# Patient Record
Sex: Female | Born: 1975 | Race: White | Hispanic: No | Marital: Married | Smoking: Never smoker
Health system: Southern US, Community
[De-identification: ages and names within clinical notes are randomized; demographics above are authoritative.]

## PROBLEM LIST (undated history)

## (undated) DIAGNOSIS — F909 Attention-deficit hyperactivity disorder, unspecified type: Secondary | ICD-10-CM

---

## 1999-08-03 ENCOUNTER — Emergency Department (HOSPITAL_COMMUNITY): Admission: EM | Admit: 1999-08-03 | Discharge: 1999-08-03 | Payer: Self-pay | Admitting: Emergency Medicine

## 1999-08-27 ENCOUNTER — Other Ambulatory Visit: Admission: RE | Admit: 1999-08-27 | Discharge: 1999-08-27 | Payer: Self-pay | Admitting: Obstetrics and Gynecology

## 2000-08-29 ENCOUNTER — Other Ambulatory Visit: Admission: RE | Admit: 2000-08-29 | Discharge: 2000-08-29 | Payer: Self-pay | Admitting: Obstetrics and Gynecology

## 2001-08-29 ENCOUNTER — Other Ambulatory Visit: Admission: RE | Admit: 2001-08-29 | Discharge: 2001-08-29 | Payer: Self-pay | Admitting: Obstetrics and Gynecology

## 2003-04-10 ENCOUNTER — Encounter: Admission: RE | Admit: 2003-04-10 | Discharge: 2003-04-10 | Payer: Self-pay | Admitting: Family Medicine

## 2003-04-30 ENCOUNTER — Ambulatory Visit (HOSPITAL_COMMUNITY): Admission: RE | Admit: 2003-04-30 | Discharge: 2003-04-30 | Payer: Self-pay | Admitting: *Deleted

## 2003-06-12 ENCOUNTER — Emergency Department (HOSPITAL_COMMUNITY): Admission: EM | Admit: 2003-06-12 | Discharge: 2003-06-12 | Payer: Self-pay | Admitting: Emergency Medicine

## 2004-01-21 ENCOUNTER — Other Ambulatory Visit: Admission: RE | Admit: 2004-01-21 | Discharge: 2004-01-21 | Payer: Self-pay | Admitting: Obstetrics and Gynecology

## 2004-02-06 ENCOUNTER — Ambulatory Visit (HOSPITAL_COMMUNITY): Admission: RE | Admit: 2004-02-06 | Discharge: 2004-02-06 | Payer: Self-pay | Admitting: Obstetrics and Gynecology

## 2004-05-15 ENCOUNTER — Ambulatory Visit (HOSPITAL_COMMUNITY): Admission: RE | Admit: 2004-05-15 | Discharge: 2004-05-15 | Payer: Self-pay | Admitting: Obstetrics and Gynecology

## 2004-07-09 ENCOUNTER — Encounter (INDEPENDENT_AMBULATORY_CARE_PROVIDER_SITE_OTHER): Payer: Self-pay | Admitting: *Deleted

## 2004-07-09 ENCOUNTER — Ambulatory Visit (HOSPITAL_COMMUNITY): Admission: RE | Admit: 2004-07-09 | Discharge: 2004-07-09 | Payer: Self-pay | Admitting: Obstetrics and Gynecology

## 2005-02-04 ENCOUNTER — Other Ambulatory Visit: Admission: RE | Admit: 2005-02-04 | Discharge: 2005-02-04 | Payer: Self-pay | Admitting: Obstetrics and Gynecology

## 2005-02-19 ENCOUNTER — Inpatient Hospital Stay (HOSPITAL_COMMUNITY): Admission: AD | Admit: 2005-02-19 | Discharge: 2005-02-19 | Payer: Self-pay | Admitting: Obstetrics and Gynecology

## 2005-08-08 ENCOUNTER — Inpatient Hospital Stay (HOSPITAL_COMMUNITY): Admission: AD | Admit: 2005-08-08 | Discharge: 2005-08-11 | Payer: Self-pay | Admitting: Obstetrics and Gynecology

## 2006-02-10 ENCOUNTER — Other Ambulatory Visit: Admission: RE | Admit: 2006-02-10 | Discharge: 2006-02-10 | Payer: Self-pay | Admitting: Obstetrics and Gynecology

## 2006-05-17 ENCOUNTER — Emergency Department (HOSPITAL_COMMUNITY): Admission: EM | Admit: 2006-05-17 | Discharge: 2006-05-17 | Payer: Self-pay | Admitting: Emergency Medicine

## 2007-09-01 ENCOUNTER — Encounter (INDEPENDENT_AMBULATORY_CARE_PROVIDER_SITE_OTHER): Payer: Self-pay | Admitting: Obstetrics and Gynecology

## 2007-09-01 ENCOUNTER — Ambulatory Visit (HOSPITAL_COMMUNITY): Admission: RE | Admit: 2007-09-01 | Discharge: 2007-09-01 | Payer: Self-pay | Admitting: Obstetrics and Gynecology

## 2008-04-16 ENCOUNTER — Inpatient Hospital Stay (HOSPITAL_COMMUNITY): Admission: AD | Admit: 2008-04-16 | Discharge: 2008-04-16 | Payer: Self-pay | Admitting: Obstetrics and Gynecology

## 2008-08-12 ENCOUNTER — Inpatient Hospital Stay (HOSPITAL_COMMUNITY): Admission: RE | Admit: 2008-08-12 | Discharge: 2008-08-14 | Payer: Self-pay | Admitting: Obstetrics and Gynecology

## 2010-05-02 ENCOUNTER — Encounter: Payer: Self-pay | Admitting: Family Medicine

## 2010-07-21 LAB — CBC
HCT: 26.6 % — ABNORMAL LOW (ref 36.0–46.0)
HCT: 32.2 % — ABNORMAL LOW (ref 36.0–46.0)
Hemoglobin: 11.2 g/dL — ABNORMAL LOW (ref 12.0–15.0)
Hemoglobin: 9.2 g/dL — ABNORMAL LOW (ref 12.0–15.0)
MCHC: 34.5 g/dL (ref 30.0–36.0)
MCHC: 34.9 g/dL (ref 30.0–36.0)
MCV: 89.5 fL (ref 78.0–100.0)
MCV: 89.9 fL (ref 78.0–100.0)
Platelets: 133 10*3/uL — ABNORMAL LOW (ref 150–400)
Platelets: 159 10*3/uL (ref 150–400)
RBC: 2.96 MIL/uL — ABNORMAL LOW (ref 3.87–5.11)
RBC: 3.6 MIL/uL — ABNORMAL LOW (ref 3.87–5.11)
RDW: 13.7 % (ref 11.5–15.5)
RDW: 14.4 % (ref 11.5–15.5)
WBC: 10.1 10*3/uL (ref 4.0–10.5)
WBC: 7.3 10*3/uL (ref 4.0–10.5)

## 2010-07-21 LAB — RPR: RPR Ser Ql: NONREACTIVE

## 2010-07-21 LAB — CCBB MATERNAL DONOR DRAW

## 2010-07-27 LAB — URINALYSIS, ROUTINE W REFLEX MICROSCOPIC
Bilirubin Urine: NEGATIVE
Glucose, UA: NEGATIVE mg/dL
Ketones, ur: NEGATIVE mg/dL
Leukocytes, UA: NEGATIVE
Nitrite: NEGATIVE
Protein, ur: NEGATIVE mg/dL
Specific Gravity, Urine: >1.03 — ABNORMAL HIGH (ref 1.005–1.030)
Urobilinogen, UA: 0.2 mg/dL (ref 0.0–1.0)
pH: 5 (ref 5.0–8.0)

## 2010-07-27 LAB — URINE CULTURE: Colony Count: >=100000

## 2010-07-27 LAB — URINE MICROSCOPIC-ADD ON

## 2010-07-27 LAB — GC/CHLAMYDIA PROBE AMP, GENITAL
Chlamydia, DNA Probe: NEGATIVE
GC Probe Amp, Genital: NEGATIVE

## 2010-07-27 LAB — WET PREP, GENITAL
Clue Cells Wet Prep HPF POC: NONE SEEN
Trich, Wet Prep: NONE SEEN
Yeast Wet Prep HPF POC: NONE SEEN

## 2010-08-25 NOTE — Op Note (Signed)
Laura Ray, MOORADIAN NO.:  [id number]   MEDICAL RECORD NO.:  [id number]          PATIENT TYPE:  AMB   LOCATION:  SDC                           [facility]   PHYSICIAN:  Dois Davenport A. Rivard, M.D. DATE OF BIRTH:  [birth year]   DATE OF PROCEDURE:  09/01/2007  DATE OF DISCHARGE:                               OPERATIVE REPORT   PREOPERATIVE DIAGNOSIS:  Moderate cervical dysplasia or CIN II.   POSTOPERATIVE DIAGNOSIS:  Moderate cervical dysplasia or CIN II.   ANESTHESIA:  Paracervical block, Dois Davenport A. Rivard, MD   PROCEDURE:  Loop electrical excision procedure or LEEP.   SURGEON:  Crist Fat. Rivard, MD   ESTIMATED BLOOD LOSS:  Minimal.   PROCEDURE:  After being informed of the planned procedure with possible  complications including bleeding, infection, and recurrence of disease  as well as cervical stenosis and cervical incompetence, informed consent  was obtained.  The patient was taken to the OR #3 and placed in  lithotomy position.  A speculum was inserted in the lip and the cervix  was prepped with acetic acid.  Using colposcope visualization, we were  able to see the T-zone and noted that the lesion was at 2 o'clock,  previously described and biopsied.  Using a large loupe, we proceed with  excision of the anterior lip as well as the posterior lip of the cervix.  We then cauterized the excision area and applied Monsel.  Instruments  were removed.  Instruments and sponge count was complete x2.  Estimated  blood loss was minimal, and the procedure was very well tolerated by the  patient who was taken to the recovery room in a well and stable  condition.   SPECIMENS:  Anterior lip and posterior lip of the cervix sent to  pathology.   Please note that after prepping the cervix with acetic acid, we perform  a paracervical block using 19 mL of lidocaine 1% with epinephrine  1:200,000, well tolerated by the patient.      Crist Fat Rivard, M.D.  Electronically Signed     SAR/MEDQ  D:  09/01/2007  T:  09/02/2007  Job:  [id number]

## 2010-08-25 NOTE — H&P (Signed)
NAMELIBERTA, Laura Ray NO.:  [id number]   MEDICAL RECORD NO.:  [id number]          PATIENT TYPE:  INP   LOCATION:                                [facility]   PHYSICIAN:  Crist Fat. Rivard, M.D. DATE OF BIRTH:  09/28/1975   DATE OF ADMISSION:  08/12/2008  DATE OF DISCHARGE:                              HISTORY & PHYSICAL   Ms. Rog is a 35 year old gravida 2, para 1-0-0-1, at 40-1/7 weeks who  presents for induction secondary to post dates.  Her pregnancy is  remarkable for:  1. History of anemia.  2. History of positive group B strep with previous pregnancy.  3. History of LEEP procedure.  4. 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  5. History of infertility, with this pregnancy an IVF pregnancy.  6. Father of baby with craniotomy with a type 2 glioma.   PRENATAL LABS:  Blood type is A positive, Rh antibody negative, VDRL  nonreactive, Rubella titer positive, hepatitis B surface antigen  negative, HIV is nonreactive.  Pap was normal in September 2009 with  ASCUS, with negative HPV.  Hemoglobin upon entering the practice was  11.1.  It was 10.3 at 27 weeks.  She had a first trimester screen that  was normal.  AFP was done that was normal.  Glucola was elevated at 147.  She had a 3-hour GTT that was normal.  Group B strep culture was  negative at 36 weeks.   HISTORY OF PRESENT PREGNANCY:  The patient entered care at approximately  10 weeks.  She had a first trimester screen that was normal.  She had an  ultrasound at that time showing a posterior previa.  She had had a small  amount of first trimester bleeding.  She had also had a Pap in January  2010 that was normal.  She had a follow-up ultrasound at 12 weeks still  showing a posterior previa.  Previa still persisted at 14 weeks.  First  trimester screen was normal.  She got a flu vaccine at 19 weeks.  She  had an ultrasound at that time showing no previa, normal  cervical length  and normal growth.  She had some palpitations at 23 weeks.  These  resolved.  Her last bleeding episode occurred at approximately 24 weeks  but has had none since that time.  She had an ultrasound at [facility] at that time without any concerns.  Her Glucola was elevated at  147.  Three hour GTT was normal.  She had some dizziness spells at 32  weeks.  She was encouraged to increase fluids.  She had a beta strep  done at 35 weeks which was negative.  She also began Valtrex  prophylaxis.  She denies any HSV prodrome or lesions.   OBSTETRICAL HISTORY:  In 2007, she had an in vitro fertilization  pregnancy. She delivered a full-term female at 40 weeks.  The baby weighed  7 pounds 14 ounces.  She was in labor for 11 hours.  She had epidural  anesthesia.  She had no complications.  Her second pregnancy is also  IVF.   MEDICAL HISTORY:  She is a previous oral contraceptive user.  She was  diagnosed with an abnormal Pap in January 2009 which was CIN-3 and she  had a LEEP procedure in May.  She also had a history of a right ovarian  cyst.  She reports usual childhood illnesses and occasional yeast  infections.  She reports anemia as a child.  She had some issues with  slow digestion with her stomach not emptying properly and nausea.  She  had had some anxiety associated with that.   SURGICAL HISTORY:  Previously noted LEEP procedure in May 2009.  She  also had a laparoscopic procedure for removal of an ovarian cyst in  March 2006.   ALLERGIES:  Z-PAK which causes shortness of breath and nausea.   FAMILY HISTORY:  Paternal grandfather had an MI and is now deceased.  Her father has chronic hypertension.  Paternal grandmother also has  hypertension.  Her mother has varicosities.  Maternal cousin had a  pituitary tumor.  Paternal grandmother and paternal uncle had stroke.  Paternal cousin had Marfan's syndrome.  Paternal uncle also has  depression.  Maternal grandmother  had colon cancer.  Maternal  grandfather had lung cancer.  Maternal aunt had cervical cancer and  lymphoma.   GENETIC HISTORY:  Remarkable for the father of baby having had a  craniotomy for a type 2 glioma, left hemisphere surgery in 2007.  Maternal grandfather is a smoker.  Father of the baby has a questionable  heart murmur.   SOCIAL HISTORY:  The patient is married to the father of baby.  He is  involved and supportive.  His name is Tenika Keeran.  The patient has a  bachelor's degree.  She is employed as a Veterinary surgeon.  Her husband  has 3 years of college.  He is employed in Insurance account manager.  She has been  followed by the physician service at [facility].  She denies  any alcohol, drug or tobacco use during this pregnancy.   PHYSICAL EXAMINATION:  VITAL SIGNS: Stable.  The patient is afebrile.  HEENT: Within normal limits.  LUNGS:  Breath sounds are clear.  HEART:  Regular rate and rhythm without murmur.  BREASTS:  Soft and nontender.  ABDOMEN:  Fundal height is approximately 39 centimeters.  Estimated  fetal weight 7-8 pounds.  Uterine contractions are per patient report  occasional and mild.  CERVICAL EXAM:  Deferred at present.  Fetus was vertex on previous  evaluation in the office.  There are no HSV lesions noted and the  patient denies any prodrome.  Fetal heart rate has been reassuring in  the office.  EXTREMITIES:  Deep tendon reflexes are 2+ without clonus.  There is  trace edema noted.   IMPRESSION:  1. Intrauterine pregnancy at 41-1/7 weeks.  2. Negative group B strep.  3. History of LEEP procedure.  4. History of herpes simplex virus with no current or recent lesions      or prodrome   PLAN:  1. Admit to birthing suite per consult with Dr. Silverio Lay as      attending physician.  2. Routine physician orders.  3. Dr. Estanislado Pandy will anticipate Pitocin induction with artificial      rupture of membranes as appropriate  4. Pain medication p.r.n.      Renaldo Reel Emilee Hero, C.N.M.  Crist Fat Rivard, M.D.  Electronically Signed   VLL/MEDQ  D:  08/11/2008  T:  08/11/2008  Job:  [id number]

## 2010-08-28 NOTE — H&P (Signed)
NAMEPARRISH, DADDARIO NO.:  [id number]   MEDICAL RECORD NO.:  [id number]          PATIENT TYPE:  MAT   LOCATION:  MATC                          [facility]   PHYSICIAN:  Crist Fat. Rivard, M.D. DATE OF BIRTH:  [birth year]   DATE OF ADMISSION:  DATE OF DISCHARGE:                                HISTORY & PHYSICAL   Ms. Pfahler is a 35 year old, gravida 1, para 0, at 39-6/7 weeks, EDD August 09, 2005, who presents with spontaneous rupture of membranes at 14:45, clear  fluid. She reports positive fetal movement, irregular contractions, no  vaginal bleeding. Denies any headache, visual changes, or epigastric pain.  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  The patient has  no prodrome and no lesions. This patient was initially evaluated at the  office of [facility] on February 04, 2005, at 13-weeks gestation. This is an IVF  pregnancy which began as a twin gestation and resolved to one viable single  fetus. The patient's pregnancy has been essentially unremarkable. Her  husband has been diagnosed with a brain tumor during her pregnancy which has  been followed by MRI. He will plan surgery in mid June. The patient has been  size equal to dates throughout, normotensive with no proteinuria. Ultrasound  exam on July 15, 2005, found a single intrauterine fetus consistent with  measurements for 36 weeks 5 days, an estimated fetal weight of 30, 79 gm at  the 76th percentile, AFI 10.44 cm, with an anterior grade 2 placenta in the  vertex presentation.   Prenatal labwork on February 04, 2006, revealed hemoglobin of 10.8, platelets  223,000, blood type A positive, antibody screen negative, VDRL nonreactive,  Rubella immune, hepatitis B surface  antigen negative, HIV negative June  2006, hepatitis C negative June 2006, CF testing negative, positive urine  culture for group B strep on December 23, 2004.  At 21 weeks, one-hour  glucose challenge within normal limits and hemoglobin of 11.0.   The patient has an allergy to [facility] and she denies the use of tobacco,  alcohol, or illicit drugs.   Her pre-gravid weight is 140 and last recorded pregnancy weight is 179  pounds.   PAST MEDICAL HISTORY:  Significant for infertility, laparoscopy with right  ovarian cyst removed. She had IVF at [facility] for reproduction and  conceived. She has a history of stomach problems and anxiety, and reports  difficulty awakening following anesthesia.   FAMILY HISTORY:  Paternal grandfather with MI. Patient's father and paternal  grandmother with chronic hypertension.  Patient's mother with varicose  veins. Maternal cousin with pituitary tumor. Paternal grandmother and  paternal uncle with stroke. The patient's maternal grandmother with colon  cancer. Maternal grandfather lung cancer. Maternal aunt cervical cancer.   GENETIC HISTORY:  Unremarkable.   SOCIAL HISTORY:  Mrs. Keshishyan  is a married Caucasian female. Her husband,  Mckynlee Luse, is involved and supportive. The patient has her bachelor's  degree and works as a Veterinary surgeon, and her husband has had 15 years of  education and works in Press photographer. They do not subscribe  to a religious faith.   REVIEW OF SYSTEMS:  As described above. The patient is typical of one with  intrauterine pregnancy at term, with premature rupture of membranes without  labor.   PHYSICAL EXAMINATION:  VITAL SIGNS: Temperature 98.2, pulse 79, respirations  20, blood pressure 138/76.  HEENT: Unremarkable.  HEART: Regular rate and rhythm.  LUNGS: Clear bilaterally.  ABDOMEN: Soft and gravid in its contour. It is nontender. The uterine fundus  extends 39 cm above the level of the pubic  symphysis. Leopold's maneuvers  finds the infant to a longitudinal lie cephalic presentation and the  estimated fetal weight is 7.5 pounds. The baseline of the fetal heart rate  monitor is 130s with average long-term variability. Reactivity is present  with no periodic changes. The patient is contracting irregularity. Sterile  speculum exam finds positive pooling, positive Nitrazine, positive fern for  clear fluid.  It is note that the patient has no external and no internal  HSV lesions. Digital exam of the cervix finds it to be 2-3 cm dilated, 70%  effaced with a vertex and a -1 station. This is unchanged from previous  office exam.  EXTREMITIES: No pathologic edema. DTRs are 1+ with no clonus. There is no  calf tenderness noted bilaterally.   ASSESSMENT:  Intrauterine pregnancy at term, premature rupture of membranes.   PLAN:  Admit per Dr. Dois Davenport Rivard. Begin penicillin G prophylaxis for  positive group B strep. Start pitocin per low-dose protocol.      Rica Koyanagi, C.N.M.      Crist Fat Rivard, M.D.  Electronically Signed    SDM/MEDQ  D:  08/08/2005  T:  08/08/2005  Job:  [id number]

## 2010-08-28 NOTE — Op Note (Signed)
NAMEKRISTINIA, LEAVY NO.:  [id number]   MEDICAL RECORD NO.:  [id number]          PATIENT TYPE:  AMB   LOCATION:  SDC                           [facility]   PHYSICIAN:  Dois Davenport A. Rivard, M.D. DATE OF BIRTH:  [birth year]   DATE OF PROCEDURE:  07/09/2004  DATE OF DISCHARGE:                                 OPERATIVE REPORT   PREOPERATIVE DIAGNOSES:  1.  Persistent right ovarian cyst.  2.  Infertility.   POSTOPERATIVE DIAGNOSES:  1.  Bilateral ovarian cysts.  2.  Pelvic adhesions.  3.  Right paratubal cyst.  4.  Minimal endometriosis.   ANESTHESIA:  General.   PROCEDURES:  1.  Right ovarian cystectomy via laparoscopy.  2.  Left ovarian cyst drainage.  3.  Right paratubal cyst removal.  4.  Lysis of adhesions.  5.  Peritoneal biopsy.  6.  Ablation of endometriosis.  7.  Chromopertubation.   SURGEON:  Crist Fat. Rivard, M.D.   ASSISTANTMarquis Lunch. Adline Peals.   PROCEDURE:  After being informed of the planned procedure and possible  complications including bleeding, infection, injury to bowel, bladder or  ureters, need for laparotomy, need for oophorectomy, informed consent was  obtained.  The patient is taken to OR #4, given general anesthesia with  endotracheal intubation without any complication.  She is placed in a  lithotomy position, prepped and draped in a sterile fashion, and a Foley  catheter is inserted in her bladder.  A speculum is inserted in the vagina,  anterior lip of the cervix is grasped with tenaculum forceps, and an acorn  manipulator is placed in the uterus.  The speculum is removed.   The umbilical area is infiltrated with 5 mL of Marcaine 0.25, and we perform  a semi-elliptical incision for insertion of Veress needle and insufflation  of a pneumoperitoneum with CO2 at a maximum pressure of 15 mmHg.  This  allows [place] easy entry of a 10 mm trocar as well as a laparoscope on video.  The suprapubic area is infiltrated with 2.5 mL  of Marcaine 0.25%, a 5 mm  incision was performed with knife, and a 5 mm trocar is inserted under  direct visualization.   Observation:  Uterus is normal.  Right tube has a 1 cm paratubal cyst but  otherwise normal.  The left tube has a small, thin adhesion joining it to  the upper left colon.  This adhesion measures approximately 12 cm.  It does  not interfere with tubal anatomy or with its function.  The tube is  otherwise normal.  The anterior cul-de-sac reveals a few brown lesions  compatible with endometriosis.  The left ovary has two cysts of 1-2 cm each,  which appear to be corpus luteum.  The right ovary is greatly enlarged with  a cyst measuring 7 cm, an elongated tubo-ovarian ligament, and comb-like  vessels, favoring a benign organic cyst.  There is no anomaly on the surface  of the ovary.  Posterior cul-de-sac has multiple small 1-2 mm brown and red  lesions and two sites of  peritoneal retraction on the right uterosacral  ligament.   We start the procedure with a right ovarian cystectomy.  A solution of  Vasopressin 20 units in 100 mL is used with the spinal needle to infiltrate  the capsule of the cyst, 5 mL are used.  Using sharp scissors, we are able  to incise the capsule of the ovary without perforating the cyst, which  allows [place] to sharply dissect the cyst on a 3 cm area.  The cyst is then  punctured, clear fluid is removed, and we are able with traction, counter  traction, to completely peel the cyst off of the ovary.  This is sent to  pathology.  We then irrigate profusely with warm saline and cauterize the  internal capsule of the ovary for complete hemostasis.  On the left side,  the cyst is grasped and the ovary is grasped at the utero-ovarian ligament  side and the capsule is also infiltrated with 2.5 mL of diluted Vasopressin.  A small incision is made with scissors, which confirms corpus luteum cyst  with the typical yellow coloration.  Because of the functional  nature of  this cyst, we are not going to perform cystectomy but we do drain yellowish  fluid from it.   We then cauterize the left tube adhesions and section it.  The right  paratubal cyst is removed, first cauterized, then sectioned and then removed  and sent to pathology.  We perform three biopsies of the posterior cul-de-  sac peritoneum to confirm the diagnosis of endometriosis and then using  bipolar cauterization, we cauterize all visible lesion.  On the right  uterosacral ligament, we first identify the route of the right ureter, which  is away from our site of cauterization.  We then irrigate profusely with  warm saline, recheck hemostasis, which is very adequate.  We then visualize  the upper abdomen with a normal appendix, normal liver edge, normal  gallbladder.  No other lesions seen throughout the abdominal-pelvic cavity.   We deflate the abdomen to a minimum intra-abdominal pressure to evaluate  hemostasis on the site of cystectomy as well as cyst drainage, and  hemostasis is adequate.   A right lower quadrant 5 mm trocar was inserted in the right lower quadrant  after infiltrating with 2.5 mL of Marcaine 0.25%, and this was done under  direct visualization before we performed cystectomy.   We then perform chromopertubation with methylene blue, which reveals easy  passage and easy spillage in both tubes.  We irrigate profusely with warm  saline to remove all dye from the pelvis.   Both 5 mm trocars are then removed, the scope is removed, pneumoperitoneum  is evacuated, and the 10 mm trocar is then removed.  Fascia of the 10 mm  incision is closed with a figure-of-eight stitch of 0 Vicryl, and all three  incisions are then closed with subcuticular suture of 4-0 Vicryl and Steri-  Strips.   Foley catheter is removed.  Acorn manipulator is removed.  Instrument and sponge count is complete x2.  Estimated blood loss is 50 mL.  The procedure  is very well-tolerated by the  patient, who is taken to the recovery room in  a well and stable condition.     SAR/MEDQ  D:  07/09/2004  T:  07/09/2004  Job:  [id number]

## 2010-08-28 NOTE — H&P (Signed)
Laura Ray, Laura Ray NO.:  [id number]   MEDICAL RECORD NO.:  [id number]          PATIENT TYPE:  AMB   LOCATION:  SDC                           [facility]   PHYSICIAN:  Dois Davenport A. Rivard, M.D. DATE OF BIRTH:  10-20-1975   DATE OF ADMISSION:  DATE OF DISCHARGE:                                HISTORY & PHYSICAL   DATE OF ADMISSION:  June 25, 2004   REASON FOR ADMISSION:  Persistent right ovarian cyst.   HISTORY OF PRESENT ILLNESS:  This is a 35 year old married black female  gravida 0 who has been followed by me since July 2004 for difficulty  conceiving. She was diagnosed with luteal phase deficiency and was offered  Clomid induction. Her first cycle was in November 2005 for which she had no  complication. Her second cycle was December 2005 which led to the  development of a right ovarian simple cyst measuring 6.3 x 4.6 x 5.6 cm.  Clomid induction was postponed in January as well as February and her  ultrasound was repeated on June 10, 2004 which showed essentially no change  in the size of her right ovarian cyst which remains simple, now measuring  5.9 x 5.8 x 4.3 cm. This young lady has been desiring pregnancy since July  2004 and has had essentially a normal investigation including a slightly  elevated TSH but a complete thyroid panel within normal limits, a normal  hysterosalpingogram, normal prolactin, normal FSH and LH, and a normal semen  analysis. We remain with unexplained infertility although clinically she is  suspected for luteal phase deficiency with the luteal phase ranging anywhere  from 10 to 11 days.   Currently she reports her cycle being 25-32 days, of normal duration, and  normal flow. She has had a spontaneous day #21 progesterone which was  adequate, and then a day #21 progesterone on Clomid 50 mg which was  borderline at 9.6.   In light of a persistent right ovarian cyst, as well as unexplained  infertility for 22 months, decision  is made with this couple to perform  laparoscopy with right ovarian cystectomy.   REVIEW OF SYSTEMS:  HEENT:  Negative. Thyroid negative. CARDIORESPIRATORY:  Normal. GASTROINTESTINAL:  Normal. GENITOURINARY:  Normal. NEUROLOGICAL:  Normal. PSYCHIATRIC:  Normal.   PAST MEDICAL HISTORY:  Chronic anemia, scoliosis, and attention deficit and  hyperactivity disorder. No current medication use and no known drug allergy.   FAMILY HISTORY:  Maternal grandmother deceased of colon cancer. Maternal  aunt survivor of ovarian cancer diagnosed in her 30s. Another maternal aunt  survivor of breast cancer diagnosed in her early 76s.   SOCIAL HISTORY:  Married, nonsmoker. 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   PHYSICAL EXAMINATION:  VITAL SIGNS:  Current weight is 138 pounds for a  height of 5 feet 4 inches. Blood pressure 114/70.  HEENT:  Normal, thyroid not enlarged.  HEART:  Regular rate and rhythm.  CHEST:  Clear.  BREASTS:  Normal.  BACK:  No CVA tenderness.  ABDOMEN:  No tenderness, masses, or hepatosplenomegaly.  EXTREMITIES:  Normal.  NEUROLOGICAL:  Within normal limits.  PELVIC:  Reveals normal external genitalia, normal vagina, normal cervix.  Uterus is anteverted, normal in size and shape. Adnexa show an increase in  the right adnexa measuring approximately 5 cm, mobile. No nodularity felt in  the posterior cul-de-sac.   ASSESSMENT:  Persistent right ovarian cyst in a patient with unexplained  infertility for over 20 months.   PLAN:  Will perform laparoscopy with ovarian cystectomy. The procedure has  been thoroughly reviewed with the patient and her husband including  duration; hospital stay; recovery; possible complications including  bleeding, infection, injury to bowel, bladder, or ureters, need for  laparotomy, need for oophorectomy. Consent was obtained.      SAR/MEDQ  D:  06/15/2004  T:  06/15/2004  Job:  [id number]

## 2011-01-06 LAB — PREGNANCY, URINE: Preg Test, Ur: NEGATIVE

## 2012-04-20 ENCOUNTER — Ambulatory Visit: Payer: Self-pay | Admitting: Obstetrics and Gynecology

## 2015-10-24 ENCOUNTER — Encounter (HOSPITAL_BASED_OUTPATIENT_CLINIC_OR_DEPARTMENT_OTHER): Payer: Self-pay | Admitting: *Deleted

## 2015-10-24 ENCOUNTER — Emergency Department (HOSPITAL_BASED_OUTPATIENT_CLINIC_OR_DEPARTMENT_OTHER)
Admission: EM | Admit: 2015-10-24 | Discharge: 2015-10-24 | Disposition: A | Discharge status: Home / Self Care | Payer: BLUE CROSS/BLUE SHIELD | Attending: Physician Assistant | Admitting: Physician Assistant

## 2015-10-24 DIAGNOSIS — A09 Infectious gastroenteritis and colitis, unspecified: Secondary | ICD-10-CM | POA: Diagnosis not present

## 2015-10-24 DIAGNOSIS — R1084 Generalized abdominal pain: Secondary | ICD-10-CM | POA: Diagnosis present

## 2015-10-24 HISTORY — DX: Attention-deficit hyperactivity disorder, unspecified type: F90.9

## 2015-10-24 LAB — COMPREHENSIVE METABOLIC PANEL
ALT: 11 U/L — ABNORMAL LOW (ref 14–54)
AST: 12 U/L — ABNORMAL LOW (ref 15–41)
Albumin: 3.1 g/dL — ABNORMAL LOW (ref 3.5–5.0)
Alkaline Phosphatase: 31 U/L — ABNORMAL LOW (ref 38–126)
Anion gap: 8 (ref 5–15)
BUN: 7 mg/dL (ref 6–20)
CO2: 26 mmol/L (ref 22–32)
Calcium: 8.5 mg/dL — ABNORMAL LOW (ref 8.9–10.3)
Chloride: 101 mmol/L (ref 101–111)
Creatinine, Ser: 0.77 mg/dL (ref 0.44–1.00)
GFR calc Af Amer: >60 mL/min (ref >60)
GFR calc non Af Amer: >60 mL/min (ref >60)
Glucose, Bld: 105 mg/dL — ABNORMAL HIGH (ref 65–99)
Potassium: 2.9 mmol/L — ABNORMAL LOW (ref 3.5–5.1)
Sodium: 135 mmol/L (ref 135–145)
Total Bilirubin: 0.3 mg/dL (ref 0.3–1.2)
Total Protein: 6.3 g/dL — ABNORMAL LOW (ref 6.5–8.1)

## 2015-10-24 LAB — CBC WITH DIFFERENTIAL/PLATELET
Basophils Absolute: 0 10*3/uL (ref 0.0–0.1)
Basophils Relative: 0 %
Eosinophils Absolute: 0 10*3/uL (ref 0.0–0.7)
Eosinophils Relative: 1 %
HCT: 34.7 % — ABNORMAL LOW (ref 36.0–46.0)
Hemoglobin: 11.6 g/dL — ABNORMAL LOW (ref 12.0–15.0)
Lymphocytes Relative: 15 %
Lymphs Abs: 0.7 10*3/uL (ref 0.7–4.0)
MCH: 28.7 pg (ref 26.0–34.0)
MCHC: 33.4 g/dL (ref 30.0–36.0)
MCV: 85.9 fL (ref 78.0–100.0)
Monocytes Absolute: 0.9 10*3/uL (ref 0.1–1.0)
Monocytes Relative: 19 %
Neutro Abs: 2.9 10*3/uL (ref 1.7–7.7)
Neutrophils Relative %: 65 %
Platelets: 245 10*3/uL (ref 150–400)
RBC: 4.04 MIL/uL (ref 3.87–5.11)
RDW: 12.6 % (ref 11.5–15.5)
WBC: 4.4 10*3/uL (ref 4.0–10.5)

## 2015-10-24 LAB — URINALYSIS, ROUTINE W REFLEX MICROSCOPIC
Bilirubin Urine: NEGATIVE
Glucose, UA: NEGATIVE mg/dL
Hgb urine dipstick: NEGATIVE
Ketones, ur: 15 mg/dL — AB
Leukocytes, UA: NEGATIVE
Nitrite: NEGATIVE
Protein, ur: NEGATIVE mg/dL
Specific Gravity, Urine: 1.003 — ABNORMAL LOW (ref 1.005–1.030)
pH: 6.5 (ref 5.0–8.0)

## 2015-10-24 LAB — LIPASE, BLOOD: Lipase: 31 U/L (ref 11–51)

## 2015-10-24 LAB — HCG, QUANTITATIVE, PREGNANCY: hCG, Beta Chain, Quant, S: <1 m[IU]/mL (ref <5)

## 2015-10-24 MED ORDER — POTASSIUM CHLORIDE CRYS ER 20 MEQ PO TBCR: 20.0000 meq | EXTENDED_RELEASE_TABLET | Freq: Every day | ORAL | Status: AC

## 2015-10-24 MED ORDER — POTASSIUM CHLORIDE CRYS ER 20 MEQ PO TBCR
40.0000 meq | EXTENDED_RELEASE_TABLET | Freq: Once | ORAL | Status: AC
  Administered 2015-10-24: 40 meq via ORAL
  Filled 2015-10-24: qty 2

## 2015-10-24 MED ORDER — ONDANSETRON HCL 4 MG/2ML IJ SOLN
4.0000 mg | Freq: Once | INTRAMUSCULAR | Status: AC
  Administered 2015-10-24: 4 mg via INTRAVENOUS
  Filled 2015-10-24: qty 2

## 2015-10-24 MED ORDER — MORPHINE SULFATE (PF) 4 MG/ML IV SOLN: 4.0000 mg | Freq: Once | INTRAVENOUS | Status: DC

## 2015-10-24 MED ORDER — SODIUM CHLORIDE 0.9 % IV BOLUS (SEPSIS)
1000.0000 mL | Freq: Once | INTRAVENOUS | Status: AC
  Administered 2015-10-24: 1000 mL via INTRAVENOUS

## 2015-10-24 MED ORDER — CIPROFLOXACIN HCL 500 MG PO TABS: 500.0000 mg | ORAL_TABLET | Freq: Two times a day (BID) | ORAL | Status: DC

## 2015-10-24 MED ORDER — POTASSIUM CHLORIDE 10 MEQ/100ML IV SOLN
10.0000 meq | Freq: Once | INTRAVENOUS | Status: AC
  Administered 2015-10-24: 10 meq via INTRAVENOUS
  Filled 2015-10-24: qty 100

## 2015-10-24 MED FILL — POTASSIUM CL ER 20 MEQ TABL: 20 | 3 days supply | Qty: 3 | Fill #0

## 2015-10-24 MED FILL — CIPROFLOXACIN HCL 500 MG TA: 500 | 7 days supply | Qty: 14 | Fill #0

## 2015-10-24 NOTE — ED Notes (Signed)
MD at bedside. 

## 2015-10-24 NOTE — ED Notes (Addendum)
Patient concerned that this incident may be related to water or food consumption or either an area they went swimming in during travel, pt reports 4/9 traveling group members with similar symptoms. Pt's children with similar symptoms. Pt reports meds prescribed in [place] as Novalgin 500mg , Paspertin 10mg , Imodium, Buscopan.

## 2015-10-24 NOTE — ED Provider Notes (Signed)
CSN: [id number]     Arrival date & time 10/24/15  [id number] History   First MD Initiated Contact with Patient 10/24/15 0905     No chief complaint on file.    (Consider location/radiation/quality/duration/timing/severity/associated sxs/prior Treatment) HPI   40 year old female accompanied by her 2 children with similar complaints of abdominal pain nausea and diarrhea.Patient and her family recently traveled over to [place] to [place] last week. She has been on for approximately 1 week. 6 days ago patient developed generalized abdominal cramping and has had recurrent diarrhea with bloody mucus approximately 5-10 bouts a day. She endorse mild nausea only one bout of emesis several days prior but none since. Normal cramping is waxing waning, mild to moderate in severity. She reports subjective fever, having lightheadedness general has weakness. She was seen at an outside hospital in [place] 3 days ago for her complaint. She received IV fluid, antinausea medication and pain medication there which provide some improvement but her symptoms still persist. She was told that her infection is bacterial in nature.  She finally returned to the [place] yesterday and decided to come to the ED today for further management. Patient states out of the 9 members on her trip, 7 has similar complaint of abdominal pain and diarrhea. She attributed to either food or water consumption or when they went swimming on the first day. Patient currently denies headache, rash, URI symptoms, chest pain, short of breath, dysuria, hematuria, or numbness. Her 2 children has since developed similar abdominal pain and diarrhea since yesterday. Patient is up-to-date with immunization.  No past medical history on file. No past surgical history on file. No family history on file. Social History  Substance Use Topics  . Smoking status: Not on file  . Smokeless tobacco: Not on file  . Alcohol Use: Not on file   OB History    No data available      Review of Systems  All other systems reviewed and are negative.     Allergies  Review of patient's allergies indicates not on file.  Home Medications   Prior to Admission medications   Not on File   There were no vitals taken for this visit. Physical Exam  Constitutional: She is oriented to person, place, and time. She appears well-developed and well-nourished. No distress.  Well-appearing Caucasian female laying in bed in no acute discomfort. Nontoxic in appearance  HENT:  Head: Atraumatic.  Mouth dry  Eyes: Conjunctivae are normal.  Neck: Neck supple.  No nuchal rigidity  Cardiovascular: Normal rate and regular rhythm.   Pulmonary/Chest: Effort normal and breath sounds normal.  Abdominal: Soft. Bowel sounds are normal. She exhibits no distension. There is tenderness (Mild diffuse abdominal tenderness without guarding or rebound tenderness).  Musculoskeletal: She exhibits no edema.  Neurological: She is alert and oriented to person, place, and time.  Skin: No rash noted.  Psychiatric: She has a normal mood and affect.  Nursing note and vitals reviewed.   ED Course  Procedures (including critical care time) Labs Review Labs Reviewed  CBC WITH DIFFERENTIAL/PLATELET - Abnormal; Notable for the following:    Hemoglobin 11.6 (*)    HCT 34.7 (*)    All other components within normal limits  COMPREHENSIVE METABOLIC PANEL - Abnormal; Notable for the following:    Potassium 2.9 (*)    Glucose, Bld 105 (*)    Calcium 8.5 (*)    Total Protein 6.3 (*)    Albumin 3.1 (*)    AST 12 (*)  ALT 11 (*)    Alkaline Phosphatase 31 (*)    All other components within normal limits  URINALYSIS, ROUTINE W REFLEX MICROSCOPIC (NOT AT [facility]) - Abnormal; Notable for the following:    Specific Gravity, Urine 1.003 (*)    Ketones, ur 15 (*)    All other components within normal limits  STOOL CULTURE  LIPASE, BLOOD  HCG, QUANTITATIVE, PREGNANCY    Imaging Review No results  found. I have personally reviewed and evaluated these images and lab results as part of my medical decision-making.   EKG Interpretation None      Date: 10/24/2015  Rate: 75  Rhythm: normal sinus rhythm  QRS Axis: normal  Intervals: normal  ST/T Wave abnormalities: normal  Conduction Disutrbances: none  Narrative Interpretation: No U waves  Old EKG Reviewed: No significant changes noted     MDM   Final diagnoses:  Traveler's diarrhea    BP 125/77 mmHg  Pulse 81  Temp(Src) 98.7 F (37.1 C) (Oral)  Resp 18  Ht 5\' 4"  (1.626 m)  Wt 54.432 kg  BMI 20.59 kg/m2  SpO2 100%  LMP 09/08/2015   9:43 AM Patient here with intermittent abdominal cramping along with persistent bloody diarrhea since traveling oversee. Family member with similar symptoms. Suspect either viral or bacterial manifestation. However patient is afebrile with stable normal vital sign. Workup initiated, IV fluid given, pain medication and antinausea medication given. Will monitor closely.  11:07 AM Labs remarkable for a potassium of 2.9. No EKG changes. Potassium supplementation given along with IV fluid and pain medication. Her white count is normal.  12:15 PM Plan to check patient for traveler's diarrhea. Suspect either Shigella salmonella as the causative factor. Will prescribe ciprofloxacin. Additional potassium supplementation provided. Encourage patient to send a stool culture for further testing as patient unable to produce one today. Return precaution discussed. Outpatient follow-up recommended. Care discussed with Dr. Thomasene Lot.  Domenic Moras, PA-C 10/24/15 [place], MD 10/24/15 1431

## 2015-10-24 NOTE — Discharge Instructions (Signed)
You have been evaluated for your diarrhea.  It is likely due to a bacterial infection.  Please obtain a stool sample and return it to the hospital to have it tested.  Take antibiotic as prescribed for the full duration.  Continue to stay hydrated, and take medications previously prescribed to you for symptomatic treatment.  Follow up with your primary care provider for further care.    Shigella Infection, Adult Shigella infection occurs when certain bacteria infect the intestines. Symptoms usually start between 2 days and 4 days after ingestion of the bacteria, but they may begin as late as 1 week after ingestion. The illness usually lasts from 5 days to 7 days. Shigella infection can spread from person to person (contagious). Most people recover completely. In rare cases, lasting problems may develop, such as arthritis, kidney problems, or abnormal blood counts. CAUSES  The bacteria that cause shigella infection are found in the stool of infected people. You can become infected by:  Eating food or drinking liquids that are contaminated with the bacteria.  Touching surfaces or objects contaminated with the bacteria and then placing your hand in your mouth.  Having direct contact with a person who is infected. This may occur while caring for someone with illness or while sharing foods or eating utensils with someone who is ill.  Swimming in contaminated water. SYMPTOMS   Diarrhea, commonly with blood, mucus, or pus.  Abdominal pain or cramps.  Fever.  Nausea.  Vomiting.  Loss of appetite.  Rectal spasms (tenesmus).  Rectum protruding out of the body (rectal prolapse). DIAGNOSIS  Your caregiver will take your history and perform a physical exam. A stool sample may also be taken and tested for the presence of Shigella bacteria. TREATMENT  Often, no treatment is needed. However, you will need to drink plenty of fluids to prevent dehydration. Preventing and treating dehydration is  important because severe dehydration can cause serious problems. In severe cases, antibiotic medicines may be given to help shorten the illness and to prevent others from being infected. Antidiarrheal medicines are not recommended. They can make your condition worse. HOME CARE INSTRUCTIONS  Wash your hands well to avoid spreading the bacteria.  Do not prepare food if you have diarrhea.  If you are given antibiotics, take them as directed. Finish them even if you start to feel better.  Only take over-the-counter or prescription medicines for pain, discomfort, or fever as directed by your caregiver.  Drink enough fluids to keep your urine clear or pale yellow. Until your diarrhea, nausea, or vomiting is under control, you should only drink clear liquids. Clear liquids are anything you can see through, such as water, broth, or non-caffeinated tea. Avoid:  Milk.  Fruit juice.  Alcohol.  Extremely hot or cold fluids.  If you do not have an appetite, do not force yourself to eat. However, you must continue to drink fluids.  If you have an appetite, eat a normal diet unless your caregiver tells you differently.  Eat a variety of complex carbohydrates (rice, wheat, potatoes, bread), lean meats, yogurt, fruits, and vegetables.  Avoid high-fat foods because they are more difficult to digest.  If you are dehydrated, ask your caregiver for specific rehydration instructions. Signs of dehydration may include:  Severe thirst.  Dry lips and mouth.  Dizziness.  Dark urine.  Decreasing urine frequency and amount.  Confusion.  Rapid breathing or pulse.  Keep all follow-up appointments as directed by your caregiver. SEEK IMMEDIATE MEDICAL CARE IF:  You are unable to keep fluids down.  You have persistent vomiting or diarrhea.  You have abdominal pain that increases or is concentrated in one small area (localized).  Your diarrhea contains increased blood or mucus.  You feel very  weak, dizzy, thirsty, or you faint.  You lose a significant amount of weight. Your caregiver can tell you how much weight loss should concern you.  You have a fever.  You feel confused. MAKE SURE YOU:   Understand these instructions.  Will watch your condition.  Will get help right away if you are not doing well or get worse.   This information is not intended to replace advice given to you by your health care provider. Make sure you discuss any questions you have with your health care provider.   Document Released: 03/26/2000 Document Revised: 04/19/2014 Document Reviewed: 05/27/2011 Elsevier Interactive Patient Education [employer].

## 2015-10-24 NOTE — ED Notes (Signed)
Pt reports "severe" abd cramping off and on x Saturday, was overseas and seen in a clinic in [place], has lab results with her. Pt states "I was told it's a bacterial infection". Pt reports pudding soft and liquid diarrhea since Saturday.

## 2015-10-27 ENCOUNTER — Telehealth (HOSPITAL_BASED_OUTPATIENT_CLINIC_OR_DEPARTMENT_OTHER): Payer: Self-pay

## 2015-10-29 LAB — STOOL CULTURE: E COLI SHIGA TOXIN ASSAY: NEGATIVE

## 2015-10-29 LAB — STOOL CULTURE REFLEX - CMPCXR

## 2015-10-29 LAB — STOOL CULTURE REFLEX - RSASHR

## 2017-12-15 DIAGNOSIS — Z30430 Encounter for insertion of intrauterine contraceptive device: Secondary | ICD-10-CM | POA: Diagnosis not present

## 2017-12-15 DIAGNOSIS — R69 Illness, unspecified: Secondary | ICD-10-CM | POA: Diagnosis not present

## 2017-12-15 DIAGNOSIS — Z113 Encounter for screening for infections with a predominantly sexual mode of transmission: Secondary | ICD-10-CM | POA: Diagnosis not present

## 2017-12-15 DIAGNOSIS — Z30431 Encounter for routine checking of intrauterine contraceptive device: Secondary | ICD-10-CM | POA: Diagnosis not present

## 2017-12-22 DIAGNOSIS — Z30431 Encounter for routine checking of intrauterine contraceptive device: Secondary | ICD-10-CM | POA: Diagnosis not present

## 2018-03-03 DIAGNOSIS — R69 Illness, unspecified: Secondary | ICD-10-CM | POA: Diagnosis not present

## 2018-05-08 DIAGNOSIS — J209 Acute bronchitis, unspecified: Secondary | ICD-10-CM | POA: Diagnosis not present

## 2018-05-09 DIAGNOSIS — J111 Influenza due to unidentified influenza virus with other respiratory manifestations: Secondary | ICD-10-CM | POA: Diagnosis not present

## 2018-08-01 MED FILL — ADDERALL XR 15 MG CAP SA: 15 | 30 days supply | Qty: 30 | Fill #0

## 2018-08-29 MED FILL — ADDERALL XR 15 MG CAP SA: 15 | 30 days supply | Qty: 30 | Fill #0

## 2018-09-18 DIAGNOSIS — R002 Palpitations: Secondary | ICD-10-CM | POA: Diagnosis not present

## 2018-09-18 DIAGNOSIS — Z13220 Encounter for screening for lipoid disorders: Secondary | ICD-10-CM | POA: Diagnosis not present

## 2018-09-18 DIAGNOSIS — Z0000 Encounter for general adult medical examination without abnormal findings: Secondary | ICD-10-CM | POA: Diagnosis not present

## 2018-09-18 DIAGNOSIS — F900 Attention-deficit hyperactivity disorder, predominantly inattentive type: Secondary | ICD-10-CM | POA: Diagnosis not present

## 2018-09-18 DIAGNOSIS — R946 Abnormal results of thyroid function studies: Secondary | ICD-10-CM | POA: Diagnosis not present

## 2018-09-27 MED FILL — LEVOTHYROXINE 50 MCG TABLET: 50 | 90 days supply | Qty: 90 | Fill #0

## 2018-09-27 MED FILL — ADDERALL XR 15 MG CAP SA: 15 | 30 days supply | Qty: 30 | Fill #0

## 2018-09-28 DIAGNOSIS — R069 Unspecified abnormalities of breathing: Secondary | ICD-10-CM | POA: Diagnosis not present

## 2018-10-16 DIAGNOSIS — G4719 Other hypersomnia: Secondary | ICD-10-CM | POA: Diagnosis not present

## 2018-10-16 DIAGNOSIS — R0681 Apnea, not elsewhere classified: Secondary | ICD-10-CM | POA: Diagnosis not present

## 2018-10-20 ENCOUNTER — Other Ambulatory Visit (HOSPITAL_BASED_OUTPATIENT_CLINIC_OR_DEPARTMENT_OTHER): Payer: Self-pay

## 2018-10-20 DIAGNOSIS — G4730 Sleep apnea, unspecified: Secondary | ICD-10-CM

## 2018-10-20 DIAGNOSIS — R0683 Snoring: Secondary | ICD-10-CM

## 2018-10-20 DIAGNOSIS — G4710 Hypersomnia, unspecified: Secondary | ICD-10-CM

## 2018-10-30 MED FILL — ADDERALL XR 15 MG CAP SA: 15 | 30 days supply | Qty: 30 | Fill #0

## 2018-11-01 DIAGNOSIS — Z3040 Encounter for surveillance of contraceptives, unspecified: Secondary | ICD-10-CM | POA: Diagnosis not present

## 2018-11-01 DIAGNOSIS — Z1231 Encounter for screening mammogram for malignant neoplasm of breast: Secondary | ICD-10-CM | POA: Diagnosis not present

## 2018-11-01 DIAGNOSIS — Z6822 Body mass index (BMI) 22.0-22.9, adult: Secondary | ICD-10-CM | POA: Diagnosis not present

## 2018-11-01 DIAGNOSIS — Z01419 Encounter for gynecological examination (general) (routine) without abnormal findings: Secondary | ICD-10-CM | POA: Diagnosis not present

## 2018-11-01 DIAGNOSIS — N939 Abnormal uterine and vaginal bleeding, unspecified: Secondary | ICD-10-CM | POA: Diagnosis not present

## 2018-11-01 MED FILL — NORETHIND-ETH ESTRAD 1-0.02: 1-20 | 84 days supply | Qty: 63 | Fill #0

## 2018-11-28 MED FILL — ADDERALL XR 15 MG CAP SA: 15 | 30 days supply | Qty: 30 | Fill #0

## 2018-12-11 ENCOUNTER — Other Ambulatory Visit: Payer: Self-pay

## 2018-12-11 ENCOUNTER — Ambulatory Visit (HOSPITAL_BASED_OUTPATIENT_CLINIC_OR_DEPARTMENT_OTHER): Payer: Self-pay | Attending: Internal Medicine | Admitting: Internal Medicine

## 2018-12-11 DIAGNOSIS — G4710 Hypersomnia, unspecified: Secondary | ICD-10-CM | POA: Insufficient documentation

## 2018-12-11 DIAGNOSIS — G4730 Sleep apnea, unspecified: Secondary | ICD-10-CM | POA: Insufficient documentation

## 2018-12-11 DIAGNOSIS — R0683 Snoring: Secondary | ICD-10-CM | POA: Insufficient documentation

## 2018-12-18 DIAGNOSIS — R0681 Apnea, not elsewhere classified: Secondary | ICD-10-CM | POA: Diagnosis not present

## 2018-12-18 NOTE — Procedures (Signed)
    NAME: Laura Ray DATE OF BIRTH:  28-Jun-1975 MEDICAL RECORD NUMBER [id number]  LOCATION: [facility]  PHYSICIAN: Marius Ditch  DATE OF STUDY: 12/11/2018  SLEEP STUDY TYPE: Out of Center Sleep Test                REFERRING PHYSICIAN: Marius Ditch, MD/Jen Percell Miller, PA-C  INDICATION FOR STUDY:  awakening gasping for breath, excessive daytime sleepiness, non-restorative sleep.   EPWORTH SLEEPINESS SCORE:  NA HEIGHT: 5\' 4"  (162.6 cm)  WEIGHT: 128 lb (58.1 kg)    Body mass index is 21.97 kg/m.  NECK SIZE: 14.5 in.  Conclusions: 1. No significant sleep disordered breathing (Respiratory Event Index (REI) 2.2/hr) - please note that REI approximates AHI but is not identical as a HST measures time of use and not time of sleep. 2. No desaturations (min O2 sat: 94%; time below 89%: 0 minutes [0% of monitored time]; oxygen desaturation index: 0/hr)  Recommendations: 1. There is no indication for CPAP or any other intervention for sleep disordered breathing.  2. Please note that a HST may underestimate the degree of obstructive sleep apnea due to the lack of EEG data. Therefore, respiratory effort related arousals (RERAs) will not be measured. If a prominent part of the patient's sleep disordered breathing are RERAs, then the HST will understate the severity of the patient's obstructive sleep apnea.  3. Please note that AASM recommends an in-lab sleep test for patients with a negative HST who have a high pretest likelihood of sleep apnea. If pretest likelihood is not thought to be high, the test excludes significant sleep apnea.  This study is a Type III Home Sleep Apnea Test measuring respiratory effort, airflow, heart rate, and oxygen saturation.   I certify that I have reviewed the entire raw data recording prior to the issuance of this report in accordance with the Standards of the American Academy of Sleep Medicine (AASM).   Marius Ditch Sleep specialist,  [place] Board of Internal Medicine  ELECTRONICALLY SIGNED ON:  12/18/2018, 9:01 PM [place] PH: [phone]   FX: [phone] [facility]

## 2018-12-26 DIAGNOSIS — R946 Abnormal results of thyroid function studies: Secondary | ICD-10-CM | POA: Diagnosis not present

## 2018-12-27 MED FILL — LEVOTHYROXINE 50 MCG TABLET: 50 | 90 days supply | Qty: 90 | Fill #0

## 2018-12-30 MED FILL — ADDERALL XR 15 MG CAP SA: 15 | 30 days supply | Qty: 30 | Fill #0

## 2019-01-26 MED FILL — ADDERALL XR 15 MG CAP SA: 15 | 30 days supply | Qty: 30 | Fill #0

## 2019-02-28 MED FILL — ADDERALL XR 15 MG CAP SA: 15 | 30 days supply | Qty: 30 | Fill #0

## 2019-03-20 DIAGNOSIS — F900 Attention-deficit hyperactivity disorder, predominantly inattentive type: Secondary | ICD-10-CM | POA: Diagnosis not present

## 2019-03-20 DIAGNOSIS — E039 Hypothyroidism, unspecified: Secondary | ICD-10-CM | POA: Diagnosis not present

## 2019-03-28 MED FILL — ADDERALL XR 15 MG CAP SA: 15 | 30 days supply | Qty: 30 | Fill #0

## 2019-03-28 MED FILL — LEVOTHYROXINE 50 MCG TABLET: 50 | 90 days supply | Qty: 90 | Fill #1

## 2019-04-27 MED FILL — ADDERALL XR 15 MG CAP SA: 15 | 30 days supply | Qty: 30 | Fill #0

## 2019-05-07 DIAGNOSIS — H524 Presbyopia: Secondary | ICD-10-CM | POA: Diagnosis not present

## 2019-05-28 MED FILL — ADDERALL XR 15 MG CAP SA: 15 | 30 days supply | Qty: 30 | Fill #0

## 2019-06-26 MED FILL — LEVOTHYROXINE 50 MCG TABLET: 50 | 90 days supply | Qty: 90 | Fill #2

## 2019-06-27 MED FILL — ADDERALL XR 15 MG CAP SA: 15 | 30 days supply | Qty: 30 | Fill #0

## 2019-07-27 MED FILL — ADDERALL XR 15 MG CAP SA: 15 | 30 days supply | Qty: 30 | Fill #0

## 2019-08-27 MED FILL — ADDERALL XR 15 MG CAP SA: 15 | 30 days supply | Qty: 30 | Fill #0

## 2019-09-21 DIAGNOSIS — F900 Attention-deficit hyperactivity disorder, predominantly inattentive type: Secondary | ICD-10-CM | POA: Diagnosis not present

## 2019-09-21 DIAGNOSIS — R1310 Dysphagia, unspecified: Secondary | ICD-10-CM | POA: Diagnosis not present

## 2019-09-21 DIAGNOSIS — E039 Hypothyroidism, unspecified: Secondary | ICD-10-CM | POA: Diagnosis not present

## 2019-09-21 DIAGNOSIS — Z131 Encounter for screening for diabetes mellitus: Secondary | ICD-10-CM | POA: Diagnosis not present

## 2019-09-21 DIAGNOSIS — Z6822 Body mass index (BMI) 22.0-22.9, adult: Secondary | ICD-10-CM | POA: Diagnosis not present

## 2019-09-21 DIAGNOSIS — Z0000 Encounter for general adult medical examination without abnormal findings: Secondary | ICD-10-CM | POA: Diagnosis not present

## 2019-09-21 DIAGNOSIS — Z13220 Encounter for screening for lipoid disorders: Secondary | ICD-10-CM | POA: Diagnosis not present

## 2019-09-21 DIAGNOSIS — B078 Other viral warts: Secondary | ICD-10-CM | POA: Diagnosis not present

## 2019-09-21 DIAGNOSIS — Z800 Family history of malignant neoplasm of digestive organs: Secondary | ICD-10-CM | POA: Diagnosis not present

## 2019-09-25 MED FILL — ADDERALL XR 15 MG CAP SA: 15 | 30 days supply | Qty: 30 | Fill #0

## 2019-09-25 MED FILL — LEVOTHYROXINE 50 MCG TABLET: 50 | 90 days supply | Qty: 90 | Fill #3

## 2019-10-11 DIAGNOSIS — K5904 Chronic idiopathic constipation: Secondary | ICD-10-CM | POA: Diagnosis not present

## 2019-10-11 DIAGNOSIS — Z800 Family history of malignant neoplasm of digestive organs: Secondary | ICD-10-CM | POA: Diagnosis not present

## 2019-10-11 DIAGNOSIS — Z1211 Encounter for screening for malignant neoplasm of colon: Secondary | ICD-10-CM | POA: Diagnosis not present

## 2019-10-11 DIAGNOSIS — K219 Gastro-esophageal reflux disease without esophagitis: Secondary | ICD-10-CM | POA: Diagnosis not present

## 2019-10-11 DIAGNOSIS — Z8371 Family history of colonic polyps: Secondary | ICD-10-CM | POA: Diagnosis not present

## 2019-10-23 MED FILL — ADDERALL XR 15 MG CAP SA: 15 | 30 days supply | Qty: 30 | Fill #0

## 2019-10-29 DIAGNOSIS — K6389 Other specified diseases of intestine: Secondary | ICD-10-CM | POA: Diagnosis not present

## 2019-10-29 DIAGNOSIS — D125 Benign neoplasm of sigmoid colon: Secondary | ICD-10-CM | POA: Diagnosis not present

## 2019-10-29 DIAGNOSIS — Z8371 Family history of colonic polyps: Secondary | ICD-10-CM | POA: Diagnosis not present

## 2019-10-29 DIAGNOSIS — Z800 Family history of malignant neoplasm of digestive organs: Secondary | ICD-10-CM | POA: Diagnosis not present

## 2019-10-29 DIAGNOSIS — K633 Ulcer of intestine: Secondary | ICD-10-CM | POA: Diagnosis not present

## 2019-10-29 DIAGNOSIS — K5000 Crohn's disease of small intestine without complications: Secondary | ICD-10-CM | POA: Diagnosis not present

## 2019-10-29 DIAGNOSIS — Z1211 Encounter for screening for malignant neoplasm of colon: Secondary | ICD-10-CM | POA: Diagnosis not present

## 2019-10-29 DIAGNOSIS — K635 Polyp of colon: Secondary | ICD-10-CM | POA: Diagnosis not present

## 2019-11-08 DIAGNOSIS — Z6822 Body mass index (BMI) 22.0-22.9, adult: Secondary | ICD-10-CM | POA: Diagnosis not present

## 2019-11-08 DIAGNOSIS — Z3040 Encounter for surveillance of contraceptives, unspecified: Secondary | ICD-10-CM | POA: Diagnosis not present

## 2019-11-08 DIAGNOSIS — Z01419 Encounter for gynecological examination (general) (routine) without abnormal findings: Secondary | ICD-10-CM | POA: Diagnosis not present

## 2019-11-08 DIAGNOSIS — Z124 Encounter for screening for malignant neoplasm of cervix: Secondary | ICD-10-CM | POA: Diagnosis not present

## 2019-11-08 DIAGNOSIS — Z1211 Encounter for screening for malignant neoplasm of colon: Secondary | ICD-10-CM | POA: Diagnosis not present

## 2019-11-08 DIAGNOSIS — N939 Abnormal uterine and vaginal bleeding, unspecified: Secondary | ICD-10-CM | POA: Diagnosis not present

## 2019-11-08 DIAGNOSIS — Z1231 Encounter for screening mammogram for malignant neoplasm of breast: Secondary | ICD-10-CM | POA: Diagnosis not present

## 2019-11-08 MED FILL — ETONOGESTREL-ETHINYL ESTRAD: 0.12-0.015 | 84 days supply | Qty: 3 | Fill #0

## 2019-11-23 MED FILL — ADDERALL XR 15 MG CAP SA: 15 | 30 days supply | Qty: 30 | Fill #0

## 2019-12-24 MED FILL — LEVOTHYROXINE 50 MCG TABLET: 50 | 90 days supply | Qty: 90 | Fill #0

## 2019-12-25 MED FILL — ADDERALL XR 15 MG CAP SA: 15 | 30 days supply | Qty: 30 | Fill #0

## 2020-01-24 MED FILL — ADDERALL XR 15 MG CAP SA: 15 | 30 days supply | Qty: 30 | Fill #0

## 2020-02-08 DIAGNOSIS — N939 Abnormal uterine and vaginal bleeding, unspecified: Secondary | ICD-10-CM | POA: Diagnosis not present

## 2020-02-26 MED FILL — ADDERALL XR 15 MG CAP SA: 15 | 30 days supply | Qty: 30 | Fill #0

## 2020-03-14 ENCOUNTER — Other Ambulatory Visit (HOSPITAL_COMMUNITY): Payer: Self-pay | Admitting: Obstetrics and Gynecology

## 2020-03-14 DIAGNOSIS — N938 Other specified abnormal uterine and vaginal bleeding: Secondary | ICD-10-CM | POA: Diagnosis not present

## 2020-03-14 DIAGNOSIS — N939 Abnormal uterine and vaginal bleeding, unspecified: Secondary | ICD-10-CM | POA: Diagnosis not present

## 2020-03-14 DIAGNOSIS — Z3202 Encounter for pregnancy test, result negative: Secondary | ICD-10-CM | POA: Diagnosis not present

## 2020-03-14 MED FILL — oxyCODONE HCL 5 MG TABS: 5 | 2 days supply | Qty: 10 | Fill #0

## 2020-03-14 MED FILL — IBUPROFEN 600 MG TABLET: 600 | 7 days supply | Qty: 30 | Fill #0

## 2020-03-17 ENCOUNTER — Other Ambulatory Visit: Payer: Self-pay | Admitting: Obstetrics and Gynecology

## 2020-03-17 DIAGNOSIS — N840 Polyp of corpus uteri: Secondary | ICD-10-CM | POA: Diagnosis not present

## 2020-03-20 ENCOUNTER — Other Ambulatory Visit (HOSPITAL_COMMUNITY): Payer: Self-pay | Admitting: Family Medicine

## 2020-03-20 MED FILL — LEVOTHYROXINE 50 MCG TABLET: 50 | 90 days supply | Qty: 90 | Fill #0

## 2020-03-24 DIAGNOSIS — E039 Hypothyroidism, unspecified: Secondary | ICD-10-CM | POA: Diagnosis not present

## 2020-03-24 DIAGNOSIS — F900 Attention-deficit hyperactivity disorder, predominantly inattentive type: Secondary | ICD-10-CM | POA: Diagnosis not present

## 2020-03-24 DIAGNOSIS — Z6822 Body mass index (BMI) 22.0-22.9, adult: Secondary | ICD-10-CM | POA: Diagnosis not present

## 2020-03-27 ENCOUNTER — Other Ambulatory Visit (HOSPITAL_COMMUNITY): Payer: Self-pay | Admitting: Family Medicine

## 2020-03-27 MED FILL — ADDERALL XR 15 MG CAP SA: 15 | 30 days supply | Qty: 30 | Fill #0

## 2020-04-01 DIAGNOSIS — N920 Excessive and frequent menstruation with regular cycle: Secondary | ICD-10-CM | POA: Diagnosis not present

## 2020-04-03 DIAGNOSIS — S61213A Laceration without foreign body of left middle finger without damage to nail, initial encounter: Secondary | ICD-10-CM | POA: Diagnosis not present

## 2020-04-07 DIAGNOSIS — Z20822 Contact with and (suspected) exposure to covid-19: Secondary | ICD-10-CM | POA: Diagnosis not present

## 2020-04-09 DIAGNOSIS — Z20828 Contact with and (suspected) exposure to other viral communicable diseases: Secondary | ICD-10-CM | POA: Diagnosis not present

## 2020-04-09 DIAGNOSIS — R5383 Other fatigue: Secondary | ICD-10-CM | POA: Diagnosis not present

## 2020-04-09 DIAGNOSIS — Z03818 Encounter for observation for suspected exposure to other biological agents ruled out: Secondary | ICD-10-CM | POA: Diagnosis not present

## 2020-04-10 DIAGNOSIS — R5383 Other fatigue: Secondary | ICD-10-CM | POA: Diagnosis not present

## 2020-04-10 DIAGNOSIS — Z03818 Encounter for observation for suspected exposure to other biological agents ruled out: Secondary | ICD-10-CM | POA: Diagnosis not present

## 2020-04-25 MED FILL — ADDERALL XR 15 MG CAP SA: 15 | 30 days supply | Qty: 30 | Fill #0

## 2020-05-09 DIAGNOSIS — H524 Presbyopia: Secondary | ICD-10-CM | POA: Diagnosis not present

## 2020-05-25 MED FILL — ADDERALL XR 15 MG CAP SA: 15 | 30 days supply | Qty: 30 | Fill #0

## 2020-06-19 ENCOUNTER — Other Ambulatory Visit (HOSPITAL_COMMUNITY): Payer: Self-pay | Admitting: Family Medicine

## 2020-06-19 MED FILL — LEVOTHYROXINE 50 MCG TABLET: 50 | 90 days supply | Qty: 90 | Fill #0

## 2020-06-23 ENCOUNTER — Other Ambulatory Visit (HOSPITAL_COMMUNITY): Payer: Self-pay | Admitting: Family Medicine

## 2020-07-04 ENCOUNTER — Other Ambulatory Visit (HOSPITAL_BASED_OUTPATIENT_CLINIC_OR_DEPARTMENT_OTHER): Payer: Self-pay

## 2020-07-08 ENCOUNTER — Other Ambulatory Visit (HOSPITAL_BASED_OUTPATIENT_CLINIC_OR_DEPARTMENT_OTHER): Payer: Self-pay

## 2020-07-22 ENCOUNTER — Other Ambulatory Visit (HOSPITAL_COMMUNITY): Payer: Self-pay

## 2020-07-22 MED FILL — Amphetamine-Dextroamphetamine Cap ER 24HR 15 MG: ORAL | 30 days supply | Qty: 30 | Fill #0 | Status: CN

## 2020-07-23 ENCOUNTER — Other Ambulatory Visit (HOSPITAL_COMMUNITY): Payer: Self-pay

## 2020-07-24 ENCOUNTER — Other Ambulatory Visit (HOSPITAL_COMMUNITY): Payer: Self-pay

## 2020-07-24 MED FILL — Amphetamine-Dextroamphetamine Cap ER 24HR 15 MG: ORAL | 30 days supply | Qty: 30 | Fill #0 | Status: AC

## 2020-07-25 ENCOUNTER — Other Ambulatory Visit (HOSPITAL_COMMUNITY): Payer: Self-pay

## 2020-08-22 ENCOUNTER — Other Ambulatory Visit (HOSPITAL_COMMUNITY): Payer: Self-pay

## 2020-08-22 MED FILL — Amphetamine-Dextroamphetamine Cap ER 24HR 15 MG: ORAL | 30 days supply | Qty: 30 | Fill #0 | Status: CN

## 2020-08-23 ENCOUNTER — Other Ambulatory Visit (HOSPITAL_COMMUNITY): Payer: Self-pay

## 2020-08-23 MED FILL — Amphetamine-Dextroamphetamine Cap ER 24HR 15 MG: ORAL | 30 days supply | Qty: 30 | Fill #0 | Status: AC

## 2020-08-25 ENCOUNTER — Other Ambulatory Visit (HOSPITAL_COMMUNITY): Payer: Self-pay

## 2020-08-25 MED ORDER — CARESTART COVID-19 HOME TEST VI KIT
PACK | 0 refills | Status: AC
  Filled 2020-08-25: qty 4, 4d supply, fill #0

## 2020-09-19 ENCOUNTER — Other Ambulatory Visit (HOSPITAL_COMMUNITY): Payer: Self-pay

## 2020-09-19 MED ORDER — LEVOTHYROXINE SODIUM 50 MCG PO TABS
50.0000 ug | ORAL_TABLET | Freq: Every morning | ORAL | 0 refills | Status: DC
  Filled 2020-09-19: qty 90, 90d supply, fill #0

## 2020-09-20 ENCOUNTER — Other Ambulatory Visit (HOSPITAL_COMMUNITY): Payer: Self-pay

## 2020-09-20 MED ORDER — AMPHETAMINE-DEXTROAMPHET ER 15 MG PO CP24
ORAL_CAPSULE | ORAL | 0 refills | Status: AC
  Filled 2020-10-22: qty 30, 30d supply, fill #0

## 2020-09-20 MED ORDER — AMPHETAMINE-DEXTROAMPHET ER 15 MG PO CP24
ORAL_CAPSULE | ORAL | 0 refills | Status: AC
  Filled 2020-11-19 – 2020-11-21 (×2): qty 30, 30d supply, fill #0

## 2020-09-20 MED ORDER — AMPHETAMINE-DEXTROAMPHET ER 15 MG PO CP24
ORAL_CAPSULE | ORAL | 0 refills | Status: AC
  Filled 2020-09-22: qty 30, 30d supply, fill #0

## 2020-09-22 ENCOUNTER — Other Ambulatory Visit (HOSPITAL_COMMUNITY): Payer: Self-pay

## 2020-09-29 ENCOUNTER — Other Ambulatory Visit (HOSPITAL_COMMUNITY): Payer: Self-pay

## 2020-09-29 DIAGNOSIS — Z0000 Encounter for general adult medical examination without abnormal findings: Secondary | ICD-10-CM | POA: Diagnosis not present

## 2020-09-29 DIAGNOSIS — E039 Hypothyroidism, unspecified: Secondary | ICD-10-CM | POA: Diagnosis not present

## 2020-09-29 DIAGNOSIS — Z6821 Body mass index (BMI) 21.0-21.9, adult: Secondary | ICD-10-CM | POA: Diagnosis not present

## 2020-09-29 DIAGNOSIS — R42 Dizziness and giddiness: Secondary | ICD-10-CM | POA: Diagnosis not present

## 2020-09-29 DIAGNOSIS — Z13220 Encounter for screening for lipoid disorders: Secondary | ICD-10-CM | POA: Diagnosis not present

## 2020-09-29 DIAGNOSIS — Z86010 Personal history of colonic polyps: Secondary | ICD-10-CM | POA: Diagnosis not present

## 2020-09-29 DIAGNOSIS — F900 Attention-deficit hyperactivity disorder, predominantly inattentive type: Secondary | ICD-10-CM | POA: Diagnosis not present

## 2020-09-29 MED ORDER — AMPHETAMINE-DEXTROAMPHET ER 15 MG PO CP24
ORAL_CAPSULE | ORAL | 0 refills | Status: DC
  Filled 2020-12-22: qty 30, 30d supply, fill #0

## 2020-10-03 ENCOUNTER — Other Ambulatory Visit (HOSPITAL_COMMUNITY): Payer: Self-pay

## 2020-10-03 ENCOUNTER — Other Ambulatory Visit (HOSPITAL_BASED_OUTPATIENT_CLINIC_OR_DEPARTMENT_OTHER): Payer: Self-pay

## 2020-10-20 ENCOUNTER — Other Ambulatory Visit (HOSPITAL_COMMUNITY): Payer: Self-pay

## 2020-10-22 ENCOUNTER — Other Ambulatory Visit (HOSPITAL_COMMUNITY): Payer: Self-pay

## 2020-10-27 ENCOUNTER — Ambulatory Visit: Payer: Self-pay

## 2020-10-27 ENCOUNTER — Ambulatory Visit (HOSPITAL_BASED_OUTPATIENT_CLINIC_OR_DEPARTMENT_OTHER)
Admission: RE | Admit: 2020-10-27 | Discharge: 2020-10-27 | Disposition: A | Discharge status: Home / Self Care | Payer: 59 | Source: Ambulatory Visit | Attending: Emergency Medicine | Admitting: Emergency Medicine

## 2020-10-27 ENCOUNTER — Other Ambulatory Visit: Payer: Self-pay

## 2020-10-27 ENCOUNTER — Ambulatory Visit
Admission: EM | Admit: 2020-10-27 | Discharge: 2020-10-27 | Disposition: A | Discharge status: Home / Self Care | Payer: 59 | Attending: Emergency Medicine | Admitting: Emergency Medicine

## 2020-10-27 DIAGNOSIS — Y929 Unspecified place or not applicable: Secondary | ICD-10-CM | POA: Diagnosis not present

## 2020-10-27 DIAGNOSIS — Y939 Activity, unspecified: Secondary | ICD-10-CM | POA: Diagnosis not present

## 2020-10-27 DIAGNOSIS — S6992XA Unspecified injury of left wrist, hand and finger(s), initial encounter: Secondary | ICD-10-CM

## 2020-10-27 DIAGNOSIS — W19XXXA Unspecified fall, initial encounter: Secondary | ICD-10-CM | POA: Diagnosis not present

## 2020-10-27 DIAGNOSIS — T1490XA Injury, unspecified, initial encounter: Secondary | ICD-10-CM | POA: Diagnosis not present

## 2020-10-27 DIAGNOSIS — M25532 Pain in left wrist: Secondary | ICD-10-CM | POA: Diagnosis not present

## 2020-10-27 NOTE — ED Triage Notes (Signed)
Correction left wrist

## 2020-10-27 NOTE — ED Provider Notes (Signed)
[facility]    CSN: [id number] Arrival date & time: 10/27/20  0948      History   Chief Complaint Chief Complaint  Patient presents with   Wrist Injury    HPI Laura Ray is a 45 y.o. female presenting today for evaluation of left wrist injury.  Reports that she slipped and fell on a rock on Saturday causing pain and swelling.  Reports pain and bruising to anterior wrist and slightly into hand.  Has had some soreness in elbow and shoulder, but denies difficulty bending.  Main concern is her left wrist.  Occasional numbness tingling into hands, fingers feel slightly stiff, but also reports soreness rather than more intense pain.  Reports slightly weakened grip strength.  HPI  Past Medical History:  Diagnosis Date   ADHD (attention deficit hyperactivity disorder)     There are no problems to display for this patient.   History reviewed. No pertinent surgical history.  OB History   No obstetric history on file.      Home Medications    Prior to Admission medications   Medication Sig Start Date End Date Taking? Authorizing Provider  amphetamine-dextroamphetamine (ADDERALL XR) 10 MG 24 hr capsule Take 10 mg by mouth daily.    [provider]  amphetamine-dextroamphetamine (ADDERALL XR) 15 MG 24 hr capsule TAKE 1 CAPSULE BY MOUTH EVERY MORNING DO NOT FILL UNTIL 07/24/20 06/23/20 12/20/20  Kathyrn Lass, MD  amphetamine-dextroamphetamine (ADDERALL XR) 15 MG 24 hr capsule TAKE 1 CAPSULE BY MOUTH EVERY MORNING DO NOT FILL UNTIL 08/23/20 06/23/20 12/20/20  Kathyrn Lass, MD  amphetamine-dextroamphetamine (ADDERALL XR) 15 MG 24 hr capsule TAKE 1 CAPSULE BY MOUTH EVERY MORNING DO NOT FILL UNTIL 06/24/20 06/23/20 12/20/20  Kathyrn Lass, MD  amphetamine-dextroamphetamine (ADDERALL XR) 15 MG 24 hr capsule TAKE 1 CAPSULE BY MOUTH EVERY MORNING (FILL 05/25/20) 03/27/20 09/23/20  Kathyrn Lass, MD  amphetamine-dextroamphetamine (ADDERALL XR) 15 MG 24 hr capsule TAKE 1 CAPSULE  BY MOUTH EVERY MORNING (FILL 04/25/20) 03/27/20 09/23/20  Kathyrn Lass, MD  amphetamine-dextroamphetamine (ADDERALL XR) 15 MG 24 hr capsule TAKE 1 CAPSULE BY MOUTH ONCE A DAY IN THE MORNING 03/27/20 09/23/20  Kathyrn Lass, MD  amphetamine-dextroamphetamine (ADDERALL XR) 15 MG 24 hr capsule Take 1 capsule by mouth once daily in the morning  (fill date 09/22/2020) 09/20/20     amphetamine-dextroamphetamine (ADDERALL XR) 15 MG 24 hr capsule Take 1 capsule by mouth once daily in the morning (fill date 10/22/20) 09/20/20     amphetamine-dextroamphetamine (ADDERALL XR) 15 MG 24 hr capsule Take 1 capsule by mouth once daily in the morning (fill date 11/21/20) 09/20/20     amphetamine-dextroamphetamine (ADDERALL XR) 15 MG 24 hr capsule Take 1 capsule by mouth in the morning (12/21/20) 09/29/20     ciprofloxacin (CIPRO) 500 MG tablet Take 1 tablet (500 mg total) by mouth 2 (two) times daily. One po bid x 7 days 10/24/15   Domenic Moras, PA-C  COVID-19 At Home Antigen Test [facility] COVID-19 HOME TEST) KIT Use as directed within package instructions 08/25/20   Edmon Crape, RPH  ibuprofen (ADVIL) 600 MG tablet TAKE 1 TABLET BY MOUTH EVERY 6 HOURS 03/14/20 03/14/21  Delsa Bern, MD  levothyroxine (SYNTHROID) 50 MCG tablet TAKE 1 TABLET BY MOUTH EVERY MORNING ON AN EMPTY STOMACH 03/20/20 03/20/21  Kathyrn Lass, MD  levothyroxine (SYNTHROID) 50 MCG tablet TAKE 1 TABLET BY MOUTH EVERY MORNING ON AN EMPTY STOMACH 09/19/20     norethindrone-ethinyl estradiol (JUNEL  FE,GILDESS FE,LOESTRIN FE) 1-20 MG-MCG tablet Take 1 tablet by mouth daily.    [provider]  potassium chloride SA (K-DUR,KLOR-CON) 20 MEQ tablet Take 1 tablet (20 mEq total) by mouth daily. 10/24/15   Domenic Moras, PA-C    Family History History reviewed. No pertinent family history.  Social History Social History   Tobacco Use   Smoking status: Never     Allergies   Zithromax [azithromycin]   Review of Systems Review of Systems   Constitutional:  Negative for fatigue and fever.  HENT:  Negative for mouth sores.   Eyes:  Negative for visual disturbance.  Respiratory:  Negative for shortness of breath.   Cardiovascular:  Negative for chest pain.  Gastrointestinal:  Negative for abdominal pain, nausea and vomiting.  Genitourinary:  Negative for genital sores.  Musculoskeletal:  Positive for arthralgias and joint swelling.  Skin:  Positive for color change. Negative for rash and wound.  Neurological:  Negative for dizziness, weakness, light-headedness and headaches.    Physical Exam Triage Vital Signs ED Triage Vitals  Enc Vitals Group     BP 10/27/20 1004 (!) 161/81     Pulse Rate 10/27/20 1004 64     Resp 10/27/20 1004 20     Temp 10/27/20 1004 98.4 F (36.9 C)     Temp src --      SpO2 10/27/20 1004 97 %     Weight --      Height --      Head Circumference --      Peak Flow --      Pain Score 10/27/20 1000 3     Pain Loc --      Pain Edu? --      Excl. in [place]? --    No data found.  Updated Vital Signs BP (!) 161/81   Pulse 64   Temp 98.4 F (36.9 C)   Resp 20   SpO2 97%   Visual Acuity Right Eye Distance:   Left Eye Distance:   Bilateral Distance:    Right Eye Near:   Left Eye Near:    Bilateral Near:     Physical Exam Vitals and nursing note reviewed.  Constitutional:      Appearance: She is well-developed.     Comments: No acute distress  HENT:     Head: Normocephalic and atraumatic.     Nose: Nose normal.  Eyes:     Conjunctiva/sclera: Conjunctivae normal.  Cardiovascular:     Rate and Rhythm: Normal rate.  Pulmonary:     Effort: Pulmonary effort is normal. No respiratory distress.  Abdominal:     General: There is no distension.  Musculoskeletal:        General: Normal range of motion.     Cervical back: Neck supple.  Skin:    General: Skin is warm and dry.     Comments: Ecchymosis noted to distal anterior wrist extending into thenar eminence, tenderness to palpation  over central distal wrist, minimal tenderness over bony prominences of distal radius and ulna or posteriorly and to dorsum of hand, radial pulse 2+  Full active range of motion of shoulder elbow and fingers at IP joints and MCP joints  Neurological:     Mental Status: She is alert and oriented to person, place, and time.     UC Treatments / Results  Labs (all labs ordered are listed, but only abnormal results are displayed) Labs Reviewed - No data to display  EKG  Radiology No results found.  Procedures Procedures (including critical care time)  Medications Ordered in UC Medications - No data to display  Initial Impression / Assessment and Plan / UC Course  I have reviewed the triage vital signs and the nursing notes.  Pertinent labs & imaging results that were available during my care of the patient were reviewed by me and considered in my medical decision making (see chart for details).     Left wrist injury-bruising noted, suspect likely sprain/contusion and placing in wrist brace with thumb spica, will send for x-ray to further rule out fracture, will call with results of x-ray, follow-up with Ortho if fracture.  Anti-inflammatories as needed, ice and elevate wrist.  Discussed strict return precautions. Patient verbalized understanding and is agreeable with plan.  Final Clinical Impressions(s) / UC Diagnoses   Final diagnoses:  Left wrist injury, initial encounter     Discharge Instructions      Please go for Xray- I will call with results Wear wrist brace for support Tylenol and ibuprofen for pain and swelling Apply ice to further help with swelling Follow-up with orthopedics if there is a fracture-contact below     ED Prescriptions   None    PDMP not reviewed this encounter.   Tamario Heal, [place] C, PA-C 10/27/20 1031

## 2020-10-27 NOTE — Discharge Instructions (Addendum)
Please go for Xray- I will call with results Wear wrist brace for support Tylenol and ibuprofen for pain and swelling Apply ice to further help with swelling Follow-up with orthopedics if there is a fracture-contact below

## 2020-10-27 NOTE — ED Triage Notes (Signed)
Pt fell after slipping on rock Saturday and injured right wrist

## 2020-11-11 DIAGNOSIS — Z3040 Encounter for surveillance of contraceptives, unspecified: Secondary | ICD-10-CM | POA: Diagnosis not present

## 2020-11-11 DIAGNOSIS — Z1231 Encounter for screening mammogram for malignant neoplasm of breast: Secondary | ICD-10-CM | POA: Diagnosis not present

## 2020-11-11 DIAGNOSIS — Z1211 Encounter for screening for malignant neoplasm of colon: Secondary | ICD-10-CM | POA: Diagnosis not present

## 2020-11-11 DIAGNOSIS — Z6822 Body mass index (BMI) 22.0-22.9, adult: Secondary | ICD-10-CM | POA: Diagnosis not present

## 2020-11-11 DIAGNOSIS — Z01419 Encounter for gynecological examination (general) (routine) without abnormal findings: Secondary | ICD-10-CM | POA: Diagnosis not present

## 2020-11-19 ENCOUNTER — Other Ambulatory Visit (HOSPITAL_COMMUNITY): Payer: Self-pay

## 2020-11-21 ENCOUNTER — Other Ambulatory Visit (HOSPITAL_COMMUNITY): Payer: Self-pay

## 2020-11-24 ENCOUNTER — Other Ambulatory Visit (HOSPITAL_COMMUNITY): Payer: Self-pay

## 2020-12-18 ENCOUNTER — Other Ambulatory Visit (HOSPITAL_COMMUNITY): Payer: Self-pay

## 2020-12-18 MED ORDER — LEVOTHYROXINE SODIUM 50 MCG PO TABS
50.0000 ug | ORAL_TABLET | Freq: Every morning | ORAL | 2 refills | Status: DC
  Filled 2020-12-18: qty 90, 90d supply, fill #0
  Filled 2021-03-11: qty 90, 90d supply, fill #1
  Filled 2021-06-15: qty 90, 90d supply, fill #2

## 2020-12-22 ENCOUNTER — Other Ambulatory Visit (HOSPITAL_COMMUNITY): Payer: Self-pay

## 2021-01-19 ENCOUNTER — Other Ambulatory Visit (HOSPITAL_COMMUNITY): Payer: Self-pay

## 2021-01-20 ENCOUNTER — Other Ambulatory Visit (HOSPITAL_COMMUNITY): Payer: Self-pay

## 2021-01-20 MED ORDER — AMPHETAMINE-DEXTROAMPHET ER 15 MG PO CP24
15.0000 mg | ORAL_CAPSULE | Freq: Every morning | ORAL | 0 refills | Status: AC
  Filled 2021-01-20: qty 30, 30d supply, fill #0

## 2021-01-20 MED ORDER — AMPHETAMINE-DEXTROAMPHET ER 15 MG PO CP24
15.0000 mg | ORAL_CAPSULE | Freq: Every morning | ORAL | 0 refills | Status: AC
  Filled 2021-02-18: qty 30, 30d supply, fill #0

## 2021-01-20 MED ORDER — AMPHETAMINE-DEXTROAMPHET ER 15 MG PO CP24
15.0000 mg | ORAL_CAPSULE | Freq: Every morning | ORAL | 0 refills | Status: DC
  Filled 2021-02-18 – 2021-03-19 (×2): qty 30, 30d supply, fill #0

## 2021-02-18 ENCOUNTER — Other Ambulatory Visit (HOSPITAL_COMMUNITY): Payer: Self-pay

## 2021-03-11 ENCOUNTER — Other Ambulatory Visit (HOSPITAL_COMMUNITY): Payer: Self-pay

## 2021-03-13 ENCOUNTER — Other Ambulatory Visit: Payer: Self-pay

## 2021-03-13 ENCOUNTER — Ambulatory Visit: Payer: 59 | Attending: Internal Medicine

## 2021-03-13 DIAGNOSIS — Z23 Encounter for immunization: Secondary | ICD-10-CM

## 2021-03-19 ENCOUNTER — Other Ambulatory Visit (HOSPITAL_COMMUNITY): Payer: Self-pay

## 2021-04-17 ENCOUNTER — Other Ambulatory Visit (HOSPITAL_COMMUNITY): Payer: Self-pay

## 2021-04-18 ENCOUNTER — Other Ambulatory Visit (HOSPITAL_COMMUNITY): Payer: Self-pay

## 2021-04-20 ENCOUNTER — Other Ambulatory Visit (HOSPITAL_COMMUNITY): Payer: Self-pay

## 2021-04-20 MED ORDER — AMPHETAMINE-DEXTROAMPHET ER 15 MG PO CP24
ORAL_CAPSULE | ORAL | 0 refills | Status: AC
  Filled 2021-04-20: qty 30, 30d supply, fill #0

## 2021-05-06 DIAGNOSIS — R030 Elevated blood-pressure reading, without diagnosis of hypertension: Secondary | ICD-10-CM | POA: Diagnosis not present

## 2021-05-06 DIAGNOSIS — E039 Hypothyroidism, unspecified: Secondary | ICD-10-CM | POA: Diagnosis not present

## 2021-05-06 DIAGNOSIS — F900 Attention-deficit hyperactivity disorder, predominantly inattentive type: Secondary | ICD-10-CM | POA: Diagnosis not present

## 2021-05-18 ENCOUNTER — Other Ambulatory Visit (HOSPITAL_COMMUNITY): Payer: Self-pay

## 2021-05-20 ENCOUNTER — Other Ambulatory Visit (HOSPITAL_COMMUNITY): Payer: Self-pay

## 2021-05-20 MED ORDER — AMPHETAMINE-DEXTROAMPHET ER 15 MG PO CP24
ORAL_CAPSULE | ORAL | 0 refills | Status: AC
  Filled 2021-05-20: qty 30, 30d supply, fill #0

## 2021-05-20 MED ORDER — AMPHETAMINE-DEXTROAMPHET ER 15 MG PO CP24
ORAL_CAPSULE | ORAL | 0 refills | Status: AC
  Filled 2021-07-22: qty 30, 30d supply, fill #0

## 2021-05-20 MED ORDER — AMPHETAMINE-DEXTROAMPHET ER 15 MG PO CP24
ORAL_CAPSULE | ORAL | 0 refills | Status: AC
  Filled 2021-06-22: qty 30, 30d supply, fill #0

## 2021-05-31 ENCOUNTER — Encounter (HOSPITAL_BASED_OUTPATIENT_CLINIC_OR_DEPARTMENT_OTHER): Payer: Self-pay | Admitting: Emergency Medicine

## 2021-05-31 ENCOUNTER — Emergency Department (HOSPITAL_BASED_OUTPATIENT_CLINIC_OR_DEPARTMENT_OTHER): Payer: 59 | Admitting: Radiology

## 2021-05-31 ENCOUNTER — Emergency Department (HOSPITAL_BASED_OUTPATIENT_CLINIC_OR_DEPARTMENT_OTHER)
Admission: EM | Admit: 2021-05-31 | Discharge: 2021-05-31 | Disposition: A | Discharge status: Home / Self Care | Payer: 59 | Attending: Emergency Medicine | Admitting: Emergency Medicine

## 2021-05-31 ENCOUNTER — Other Ambulatory Visit: Payer: Self-pay

## 2021-05-31 DIAGNOSIS — S91332A Puncture wound without foreign body, left foot, initial encounter: Secondary | ICD-10-CM | POA: Diagnosis not present

## 2021-05-31 DIAGNOSIS — M79672 Pain in left foot: Secondary | ICD-10-CM | POA: Diagnosis not present

## 2021-05-31 DIAGNOSIS — R52 Pain, unspecified: Secondary | ICD-10-CM

## 2021-05-31 DIAGNOSIS — Z23 Encounter for immunization: Secondary | ICD-10-CM | POA: Diagnosis not present

## 2021-05-31 DIAGNOSIS — S99922A Unspecified injury of left foot, initial encounter: Secondary | ICD-10-CM | POA: Diagnosis present

## 2021-05-31 DIAGNOSIS — W450XXA Nail entering through skin, initial encounter: Secondary | ICD-10-CM | POA: Insufficient documentation

## 2021-05-31 MED ORDER — TETANUS-DIPHTH-ACELL PERTUSSIS 5-2.5-18.5 LF-MCG/0.5 IM SUSY
0.5000 mL | PREFILLED_SYRINGE | Freq: Once | INTRAMUSCULAR | Status: AC
  Administered 2021-05-31: 0.5 mL via INTRAMUSCULAR
  Filled 2021-05-31: qty 0.5

## 2021-05-31 MED ORDER — CIPROFLOXACIN HCL 500 MG PO TABS: 500.0000 mg | ORAL_TABLET | Freq: Two times a day (BID) | ORAL | 0 refills | Status: DC

## 2021-05-31 MED ORDER — CIPROFLOXACIN HCL 500 MG PO TABS
500.0000 mg | ORAL_TABLET | Freq: Two times a day (BID) | ORAL | 0 refills | Status: AC
Start: 2021-05-31 — End: ?
  Filled 2021-05-31: qty 14, 7d supply, fill #0

## 2021-05-31 NOTE — Discharge Instructions (Addendum)
It was a pleasure taking care of you!   Your x-ray was negative for foreign body.  You will be sent a prescription for the ciprofloxacin, ensure to complete the entire course as directed.  Keep the area clean and dry.  You may take over-the-counter 500 mg Tylenol every 6 hours as needed for pain for no more than 7 days.  You may follow-up with your primary care provider as needed for wound check within the next 3-5 days.  Return to the emergency department if you are experiencing increasing/worsening redness, swelling, pain, drainage, worsening symptoms.

## 2021-05-31 NOTE — ED Notes (Signed)
Patient transported to CT 

## 2021-05-31 NOTE — ED Provider Notes (Signed)
[facility]ENC[facility] DEPT Provider Note   CSN: [id number] Arrival date & time: 05/31/21  1219     History  Chief Complaint  Patient presents with   Foot Injury    Laura Ray is a 46 y.o. female who presents to the ED complaining of left foot injury onset today.  Patient notes that she stepped on a screw that was attached to a wooden board while working in the muddy area.  Patient notes she was wearing shoes and the screw went through her shoe and punctured her foot.  Patient removed the screw from her foot prior to arrival to the ED.  Has associated left foot pain.  She has not tried medication for his symptoms.  Unsure of tetanus status.  Denies swelling, color change, drainage.  Denies history of diabetes.  The history is provided by the patient. No language interpreter was used.      Home Medications Prior to Admission medications   Medication Sig Start Date End Date Taking? Authorizing Provider  amphetamine-dextroamphetamine (ADDERALL XR) 10 MG 24 hr capsule Take 10 mg by mouth daily.    [provider]  amphetamine-dextroamphetamine (ADDERALL XR) 15 MG 24 hr capsule TAKE 1 CAPSULE BY MOUTH EVERY MORNING DO NOT FILL UNTIL 07/24/20 06/23/20 12/20/20  Kathyrn Lass, MD  amphetamine-dextroamphetamine (ADDERALL XR) 15 MG 24 hr capsule TAKE 1 CAPSULE BY MOUTH EVERY MORNING DO NOT FILL UNTIL 08/23/20 06/23/20 12/20/20  Kathyrn Lass, MD  amphetamine-dextroamphetamine (ADDERALL XR) 15 MG 24 hr capsule TAKE 1 CAPSULE BY MOUTH EVERY MORNING DO NOT FILL UNTIL 06/24/20 06/23/20 12/20/20  Kathyrn Lass, MD  amphetamine-dextroamphetamine (ADDERALL XR) 15 MG 24 hr capsule TAKE 1 CAPSULE BY MOUTH EVERY MORNING (FILL 05/25/20) 03/27/20 09/23/20  Kathyrn Lass, MD  amphetamine-dextroamphetamine (ADDERALL XR) 15 MG 24 hr capsule TAKE 1 CAPSULE BY MOUTH EVERY MORNING (FILL 04/25/20) 03/27/20 09/23/20  Kathyrn Lass, MD  amphetamine-dextroamphetamine (ADDERALL XR) 15 MG 24 hr capsule TAKE  1 CAPSULE BY MOUTH ONCE A DAY IN THE MORNING 03/27/20 09/23/20  Kathyrn Lass, MD  amphetamine-dextroamphetamine (ADDERALL XR) 15 MG 24 hr capsule Take 1 capsule by mouth once daily in the morning  (fill date 09/22/2020) 09/20/20     amphetamine-dextroamphetamine (ADDERALL XR) 15 MG 24 hr capsule Take 1 capsule by mouth once daily in the morning (fill date 10/22/20) 09/20/20     amphetamine-dextroamphetamine (ADDERALL XR) 15 MG 24 hr capsule Take 1 capsule by mouth once daily in the morning (fill date 11/21/20) 09/20/20     amphetamine-dextroamphetamine (ADDERALL XR) 15 MG 24 hr capsule Take 1 capsule by mouth every morning (fill 02/18/21) 01/20/21     amphetamine-dextroamphetamine (ADDERALL XR) 15 MG 24 hr capsule Take 1 capsule by mouth every morning 01/20/21     amphetamine-dextroamphetamine (ADDERALL XR) 15 MG 24 hr capsule Take 1 capsule by mouth once daily in the morning 04/19/21     amphetamine-dextroamphetamine (ADDERALL XR) 15 MG 24 hr capsule Take 1 capsule by mouth in the morning daily ( Do not fill until 06/15/21 06/15/21     amphetamine-dextroamphetamine (ADDERALL XR) 15 MG 24 hr capsule Take 1 capsule by mouth in the morning daily 05/19/21     amphetamine-dextroamphetamine (ADDERALL XR) 15 MG 24 hr capsule Take 1 capsule by mouth in the morning daily * Do not fill until 07/15/21 07/15/21     ciprofloxacin (CIPRO) 500 MG tablet Take 1 tablet (500 mg total) by mouth 2 (two) times daily. One po bid x 7 days 05/31/21  Remon Quinto A, PA-C  COVID-19 At Home Antigen Test [facility] COVID-19 HOME TEST) KIT Use as directed within package instructions 08/25/20   Edmon Crape, [facility]  levothyroxine (SYNTHROID) 50 MCG tablet TAKE 1 TABLET BY MOUTH EVERY MORNING ON AN EMPTY STOMACH 03/20/20 03/20/21  Kathyrn Lass, MD  levothyroxine (SYNTHROID) 50 MCG tablet TAKE 1 TABLET BY MOUTH EVERY MORNING ON AN EMPTY STOMACH 12/18/20     norethindrone-ethinyl estradiol (JUNEL FE,GILDESS FE,LOESTRIN FE) 1-20 MG-MCG tablet Take 1  tablet by mouth daily.    [provider]  potassium chloride SA (K-DUR,KLOR-CON) 20 MEQ tablet Take 1 tablet (20 mEq total) by mouth daily. 10/24/15   Domenic Moras, PA-C      Allergies    Zithromax [azithromycin]    Review of Systems   Review of Systems  Constitutional:  Negative for chills and fever.  Musculoskeletal:  Positive for arthralgias. Negative for joint swelling.  Skin:  Positive for wound. Negative for color change.  All other systems reviewed and are negative.  Physical Exam Updated Vital Signs BP (!) 158/112    Pulse 82    Temp 98.2 F (36.8 C) (Oral)    Resp 16    Ht _0  (1.626 m)    Wt 56.7 kg    SpO2 100%    BMI 21.46 kg/m  Physical Exam Vitals and nursing note reviewed.  Constitutional:      General: She is not in acute distress.    Appearance: Normal appearance.  Eyes:     General: No scleral icterus.    Extraocular Movements: Extraocular movements intact.  Cardiovascular:     Rate and Rhythm: Normal rate.  Pulmonary:     Effort: Pulmonary effort is normal. No respiratory distress.  Musculoskeletal:     Cervical back: Neck supple.     Comments: Mild tenderness to palpation to plantar aspect of left foot.  Puncture wound noted to mid plantar aspect of left foot.  No erythema, drainage, swelling noted.  Full active range of motion of left foot and ankle.  Strength and sensation intact to bilateral feet.  Pedal pulses intact.  See picture below.   Skin:    General: Skin is warm and dry.     Findings: No bruising, erythema or rash.  Neurological:     Mental Status: She is alert.  Psychiatric:        Behavior: Behavior normal.     ED Results / Procedures / Treatments   Labs (all labs ordered are listed, but only abnormal results are displayed) Labs Reviewed - No data to display  EKG None  Radiology DG Foot Complete Left  Result Date: 05/31/2021 CLINICAL DATA:  Acute LEFT foot pain after stepping on screw. Initial encounter. EXAM: LEFT  FOOT - COMPLETE 3+ VIEW COMPARISON:  None. FINDINGS: There is no evidence of acute fracture or dislocation. No radiopaque foreign bodies are noted. No focal bony lesions are identified. IMPRESSION: No evidence of radiopaque foreign body or acute bony abnormality. Electronically Signed   By: Margarette Canada M.D.   On: 05/31/2021 13:42    Procedures Procedures    Medications Ordered in ED Medications  Tdap (BOOSTRIX) injection 0.5 mL (0.5 mLs Intramuscular Given 05/31/21 1337)    ED Course/ Medical Decision Making/ A&P Clinical Course as of 05/31/21 1439  Sun May 31, 2021  1355 Re-evaluated and notified of imaging findings. Will irrigate wound and apply sterile dressing. [SB]    Clinical Course User Index [SB] Janautica Netzley  A, PA-C                           Medical Decision Making Amount and/or Complexity of Data Reviewed Radiology: ordered.  Risk Prescription drug management.   Patient with left foot injury onset prior to arrival.  Patient was outside when she walked on a wooden plank and accidentally stepped on a screw protruding from the wooden plank.  Patient is a screw went through her shoe and punctured her foot she had to remove the screw herself.  On exam patient with mild tenderness to palpation to plantar aspect of left foot with puncture wound noted.  No surrounding erythema, drainage, swelling.  Full active range of motion.  Pedal pulses intact.  Strength and sensation intact bilateral feet.  Patient unsure of tetanus status.  Differential diagnosis includes fracture, dislocation, foreign body, cellulitis.   Imaging: I ordered imaging studies including left foot x-ray I independently visualized and interpreted imaging which showed: No acute fracture, dislocation, or foreign body. I agree with the radiologist interpretation   Disposition: Patient presentation suspicious for puncture wound without surrounding indications of cellulitis.  Tetanus updated in the ED.  Wound  thoroughly irrigated without evidence of foreign body.  Doubt fracture or dislocation at this time.  After consideration of the diagnostic results and the patients response to treatment, I feel that the patient would benefit from discharge home.  Discussed with patient that they may follow-up with their primary care provider in 3-5 days for a wound check as needed.  We will send prescription of Cipro to cover prophylactically.  Supportive care measures and strict return precautions discussed with patient at bedside. Pt acknowledges and verbalizes understanding. Pt appears safe for discharge. Follow up as indicated in discharge paperwork.    This chart was dictated using voice recognition software, Dragon. Despite the best efforts of this provider to proofread and correct errors, errors may still occur which can change documentation meaning.   Final Clinical Impression(s) / ED Diagnoses Final diagnoses:  Pain  Left foot pain  Puncture wound of foot excluding toes without complication, left, initial encounter    Rx / DC Orders ED Discharge Orders          Ordered    ciprofloxacin (CIPRO) 500 MG tablet  2 times daily        05/31/21 1434              Rowynn Mcweeney A, PA-C 05/31/21 1833    Regan Lemming, MD 06/01/21 1450

## 2021-05-31 NOTE — ED Triage Notes (Signed)
Pt reports stepping on screw today with left foot.

## 2021-06-01 ENCOUNTER — Other Ambulatory Visit (HOSPITAL_COMMUNITY): Payer: Self-pay

## 2021-06-15 ENCOUNTER — Other Ambulatory Visit (HOSPITAL_COMMUNITY): Payer: Self-pay

## 2021-06-22 ENCOUNTER — Other Ambulatory Visit (HOSPITAL_COMMUNITY): Payer: Self-pay

## 2021-07-08 DIAGNOSIS — H52223 Regular astigmatism, bilateral: Secondary | ICD-10-CM | POA: Diagnosis not present

## 2021-07-08 DIAGNOSIS — H524 Presbyopia: Secondary | ICD-10-CM | POA: Diagnosis not present

## 2021-07-22 ENCOUNTER — Other Ambulatory Visit (HOSPITAL_COMMUNITY): Payer: Self-pay

## 2021-08-20 ENCOUNTER — Other Ambulatory Visit (HOSPITAL_COMMUNITY): Payer: Self-pay

## 2021-08-22 ENCOUNTER — Other Ambulatory Visit (HOSPITAL_COMMUNITY): Payer: Self-pay

## 2021-08-24 ENCOUNTER — Other Ambulatory Visit (HOSPITAL_COMMUNITY): Payer: Self-pay

## 2021-08-24 MED ORDER — AMPHETAMINE-DEXTROAMPHET ER 15 MG PO CP24: ORAL_CAPSULE | ORAL | 0 refills | Status: AC

## 2021-08-24 MED ORDER — AMPHETAMINE-DEXTROAMPHET ER 15 MG PO CP24
ORAL_CAPSULE | ORAL | 0 refills | Status: AC
  Filled 2021-09-24: qty 30, 30d supply, fill #0

## 2021-08-24 MED ORDER — AMPHETAMINE-DEXTROAMPHET ER 15 MG PO CP24
ORAL_CAPSULE | ORAL | 0 refills | Status: AC
  Filled 2021-08-25: qty 30, 30d supply, fill #0

## 2021-08-25 ENCOUNTER — Other Ambulatory Visit (HOSPITAL_COMMUNITY): Payer: Self-pay

## 2021-09-14 ENCOUNTER — Other Ambulatory Visit (HOSPITAL_COMMUNITY): Payer: Self-pay

## 2021-09-14 MED ORDER — LEVOTHYROXINE SODIUM 50 MCG PO TABS
50.0000 ug | ORAL_TABLET | Freq: Every morning | ORAL | 1 refills | Status: DC
  Filled 2021-09-14: qty 90, 90d supply, fill #0
  Filled 2021-12-08: qty 90, 90d supply, fill #1

## 2021-09-24 ENCOUNTER — Other Ambulatory Visit (HOSPITAL_COMMUNITY): Payer: Self-pay

## 2021-10-27 DIAGNOSIS — Z0000 Encounter for general adult medical examination without abnormal findings: Secondary | ICD-10-CM | POA: Diagnosis not present

## 2021-10-27 DIAGNOSIS — Z13220 Encounter for screening for lipoid disorders: Secondary | ICD-10-CM | POA: Diagnosis not present

## 2021-10-27 DIAGNOSIS — B078 Other viral warts: Secondary | ICD-10-CM | POA: Diagnosis not present

## 2021-10-27 DIAGNOSIS — E039 Hypothyroidism, unspecified: Secondary | ICD-10-CM | POA: Diagnosis not present

## 2021-10-27 DIAGNOSIS — R030 Elevated blood-pressure reading, without diagnosis of hypertension: Secondary | ICD-10-CM | POA: Diagnosis not present

## 2021-10-27 DIAGNOSIS — F900 Attention-deficit hyperactivity disorder, predominantly inattentive type: Secondary | ICD-10-CM | POA: Diagnosis not present

## 2021-11-16 DIAGNOSIS — Z1231 Encounter for screening mammogram for malignant neoplasm of breast: Secondary | ICD-10-CM | POA: Diagnosis not present

## 2021-11-18 DIAGNOSIS — Z01419 Encounter for gynecological examination (general) (routine) without abnormal findings: Secondary | ICD-10-CM | POA: Diagnosis not present

## 2021-11-18 DIAGNOSIS — Z3040 Encounter for surveillance of contraceptives, unspecified: Secondary | ICD-10-CM | POA: Diagnosis not present

## 2021-11-18 DIAGNOSIS — Z1231 Encounter for screening mammogram for malignant neoplasm of breast: Secondary | ICD-10-CM | POA: Diagnosis not present

## 2021-11-18 DIAGNOSIS — Z1211 Encounter for screening for malignant neoplasm of colon: Secondary | ICD-10-CM | POA: Diagnosis not present

## 2021-11-18 DIAGNOSIS — Z6822 Body mass index (BMI) 22.0-22.9, adult: Secondary | ICD-10-CM | POA: Diagnosis not present

## 2021-12-08 ENCOUNTER — Other Ambulatory Visit (HOSPITAL_COMMUNITY): Payer: Self-pay

## 2021-12-15 ENCOUNTER — Other Ambulatory Visit (HOSPITAL_COMMUNITY): Payer: Self-pay

## 2021-12-16 ENCOUNTER — Other Ambulatory Visit (HOSPITAL_COMMUNITY): Payer: Self-pay

## 2021-12-16 MED ORDER — LEVOTHYROXINE SODIUM 50 MCG PO TABS
ORAL_TABLET | ORAL | 0 refills | Status: DC
  Filled 2021-12-16: qty 90, 90d supply, fill #0

## 2022-02-25 ENCOUNTER — Other Ambulatory Visit (HOSPITAL_COMMUNITY): Payer: Self-pay

## 2022-02-26 ENCOUNTER — Other Ambulatory Visit (HOSPITAL_COMMUNITY): Payer: Self-pay

## 2022-02-26 MED ORDER — LEVOTHYROXINE SODIUM 50 MCG PO TABS
50.0000 ug | ORAL_TABLET | Freq: Every morning | ORAL | 0 refills | Status: DC
  Filled 2022-02-26: qty 90, 90d supply, fill #0

## 2022-03-19 DIAGNOSIS — E039 Hypothyroidism, unspecified: Secondary | ICD-10-CM | POA: Diagnosis not present

## 2022-05-13 ENCOUNTER — Other Ambulatory Visit (HOSPITAL_COMMUNITY): Payer: Self-pay

## 2022-05-14 ENCOUNTER — Other Ambulatory Visit (HOSPITAL_COMMUNITY): Payer: Self-pay

## 2022-05-14 MED ORDER — LEVOTHYROXINE SODIUM 50 MCG PO TABS
50.0000 ug | ORAL_TABLET | Freq: Every morning | ORAL | 3 refills | Status: DC
  Filled 2022-05-14 – 2022-05-23 (×2): qty 90, 90d supply, fill #0
  Filled 2022-08-24: qty 90, 90d supply, fill #1
  Filled 2022-11-18: qty 90, 90d supply, fill #2
  Filled 2023-02-19: qty 90, 90d supply, fill #3

## 2022-05-17 ENCOUNTER — Other Ambulatory Visit (HOSPITAL_COMMUNITY): Payer: Self-pay

## 2022-05-23 ENCOUNTER — Other Ambulatory Visit: Payer: Self-pay

## 2022-05-24 ENCOUNTER — Other Ambulatory Visit: Payer: Self-pay

## 2022-07-01 DIAGNOSIS — H524 Presbyopia: Secondary | ICD-10-CM | POA: Diagnosis not present

## 2022-07-16 ENCOUNTER — Other Ambulatory Visit: Payer: Self-pay

## 2022-08-24 ENCOUNTER — Other Ambulatory Visit (HOSPITAL_COMMUNITY): Payer: Self-pay

## 2022-09-09 ENCOUNTER — Ambulatory Visit
Admission: RE | Admit: 2022-09-09 | Discharge: 2022-09-09 | Disposition: A | Discharge status: Home / Self Care | Payer: 59 | Source: Ambulatory Visit | Attending: Family Medicine | Admitting: Family Medicine

## 2022-09-09 ENCOUNTER — Other Ambulatory Visit: Payer: Self-pay | Admitting: Family Medicine

## 2022-09-09 DIAGNOSIS — I10 Essential (primary) hypertension: Secondary | ICD-10-CM | POA: Diagnosis not present

## 2022-09-09 DIAGNOSIS — Z6825 Body mass index (BMI) 25.0-25.9, adult: Secondary | ICD-10-CM | POA: Diagnosis not present

## 2022-09-09 DIAGNOSIS — S99922A Unspecified injury of left foot, initial encounter: Secondary | ICD-10-CM

## 2022-09-09 DIAGNOSIS — M79675 Pain in left toe(s): Secondary | ICD-10-CM | POA: Diagnosis not present

## 2022-10-25 ENCOUNTER — Other Ambulatory Visit: Payer: Self-pay | Admitting: Oncology

## 2022-10-25 DIAGNOSIS — Z006 Encounter for examination for normal comparison and control in clinical research program: Secondary | ICD-10-CM

## 2022-11-03 DIAGNOSIS — N951 Menopausal and female climacteric states: Secondary | ICD-10-CM | POA: Diagnosis not present

## 2022-11-03 DIAGNOSIS — E039 Hypothyroidism, unspecified: Secondary | ICD-10-CM | POA: Diagnosis not present

## 2022-11-03 DIAGNOSIS — F900 Attention-deficit hyperactivity disorder, predominantly inattentive type: Secondary | ICD-10-CM | POA: Diagnosis not present

## 2022-11-03 DIAGNOSIS — Z6823 Body mass index (BMI) 23.0-23.9, adult: Secondary | ICD-10-CM | POA: Diagnosis not present

## 2022-11-03 DIAGNOSIS — Z0000 Encounter for general adult medical examination without abnormal findings: Secondary | ICD-10-CM | POA: Diagnosis not present

## 2022-11-03 DIAGNOSIS — Z86010 Personal history of colonic polyps: Secondary | ICD-10-CM | POA: Diagnosis not present

## 2022-11-18 ENCOUNTER — Other Ambulatory Visit (HOSPITAL_COMMUNITY): Payer: Self-pay

## 2022-11-19 ENCOUNTER — Other Ambulatory Visit (HOSPITAL_COMMUNITY): Payer: Self-pay

## 2022-12-10 ENCOUNTER — Other Ambulatory Visit (HOSPITAL_COMMUNITY)
Admission: RE | Admit: 2022-12-10 | Discharge: 2022-12-10 | Disposition: A | Discharge status: Home / Self Care | Payer: 59 | Source: Ambulatory Visit | Attending: Oncology | Admitting: Oncology

## 2022-12-10 DIAGNOSIS — Z006 Encounter for examination for normal comparison and control in clinical research program: Secondary | ICD-10-CM

## 2022-12-21 LAB — GENECONNECT MOLECULAR SCREEN: Genetic Analysis Overall Interpretation: NEGATIVE

## 2023-01-04 DIAGNOSIS — Z1231 Encounter for screening mammogram for malignant neoplasm of breast: Secondary | ICD-10-CM | POA: Diagnosis not present

## 2023-01-04 DIAGNOSIS — Z6822 Body mass index (BMI) 22.0-22.9, adult: Secondary | ICD-10-CM | POA: Diagnosis not present

## 2023-01-04 DIAGNOSIS — N951 Menopausal and female climacteric states: Secondary | ICD-10-CM | POA: Diagnosis not present

## 2023-01-04 DIAGNOSIS — Z01419 Encounter for gynecological examination (general) (routine) without abnormal findings: Secondary | ICD-10-CM | POA: Diagnosis not present

## 2023-01-04 DIAGNOSIS — Z3040 Encounter for surveillance of contraceptives, unspecified: Secondary | ICD-10-CM | POA: Diagnosis not present

## 2023-01-04 DIAGNOSIS — Z1211 Encounter for screening for malignant neoplasm of colon: Secondary | ICD-10-CM | POA: Diagnosis not present

## 2023-01-04 DIAGNOSIS — Z139 Encounter for screening, unspecified: Secondary | ICD-10-CM | POA: Diagnosis not present

## 2023-01-27 ENCOUNTER — Other Ambulatory Visit (HOSPITAL_BASED_OUTPATIENT_CLINIC_OR_DEPARTMENT_OTHER): Payer: Self-pay

## 2023-01-27 MED ORDER — INFLUENZA VIRUS VACC SPLIT PF (FLUZONE) 0.5 ML IM SUSY
0.5000 mL | PREFILLED_SYRINGE | Freq: Once | INTRAMUSCULAR | 0 refills | Status: AC
  Filled 2023-01-27: qty 0.5, 1d supply, fill #0

## 2023-02-19 ENCOUNTER — Other Ambulatory Visit (HOSPITAL_COMMUNITY): Payer: Self-pay

## 2023-02-28 DIAGNOSIS — Z23 Encounter for immunization: Secondary | ICD-10-CM | POA: Diagnosis not present

## 2023-05-11 ENCOUNTER — Other Ambulatory Visit (HOSPITAL_COMMUNITY): Payer: Self-pay

## 2023-05-11 ENCOUNTER — Other Ambulatory Visit (HOSPITAL_BASED_OUTPATIENT_CLINIC_OR_DEPARTMENT_OTHER): Payer: Self-pay

## 2023-05-11 MED ORDER — LEVOTHYROXINE SODIUM 50 MCG PO TABS
50.0000 ug | ORAL_TABLET | Freq: Every morning | ORAL | 1 refills | Status: DC
  Filled 2023-05-11: qty 90, 90d supply, fill #0
  Filled 2023-08-08 – 2023-08-15 (×2): qty 90, 90d supply, fill #1

## 2023-06-13 DIAGNOSIS — H524 Presbyopia: Secondary | ICD-10-CM | POA: Diagnosis not present

## 2023-06-23 IMAGING — DX DG FOOT COMPLETE 3+V*L*
3 series · 3 of 3 positions shown · non-contrast
Comparison: None.

CLINICAL DATA: Acute LEFT foot pain after stepping on screw.
Initial encounter.

EXAM:
LEFT FOOT - COMPLETE 3+ VIEW

[foot ap]
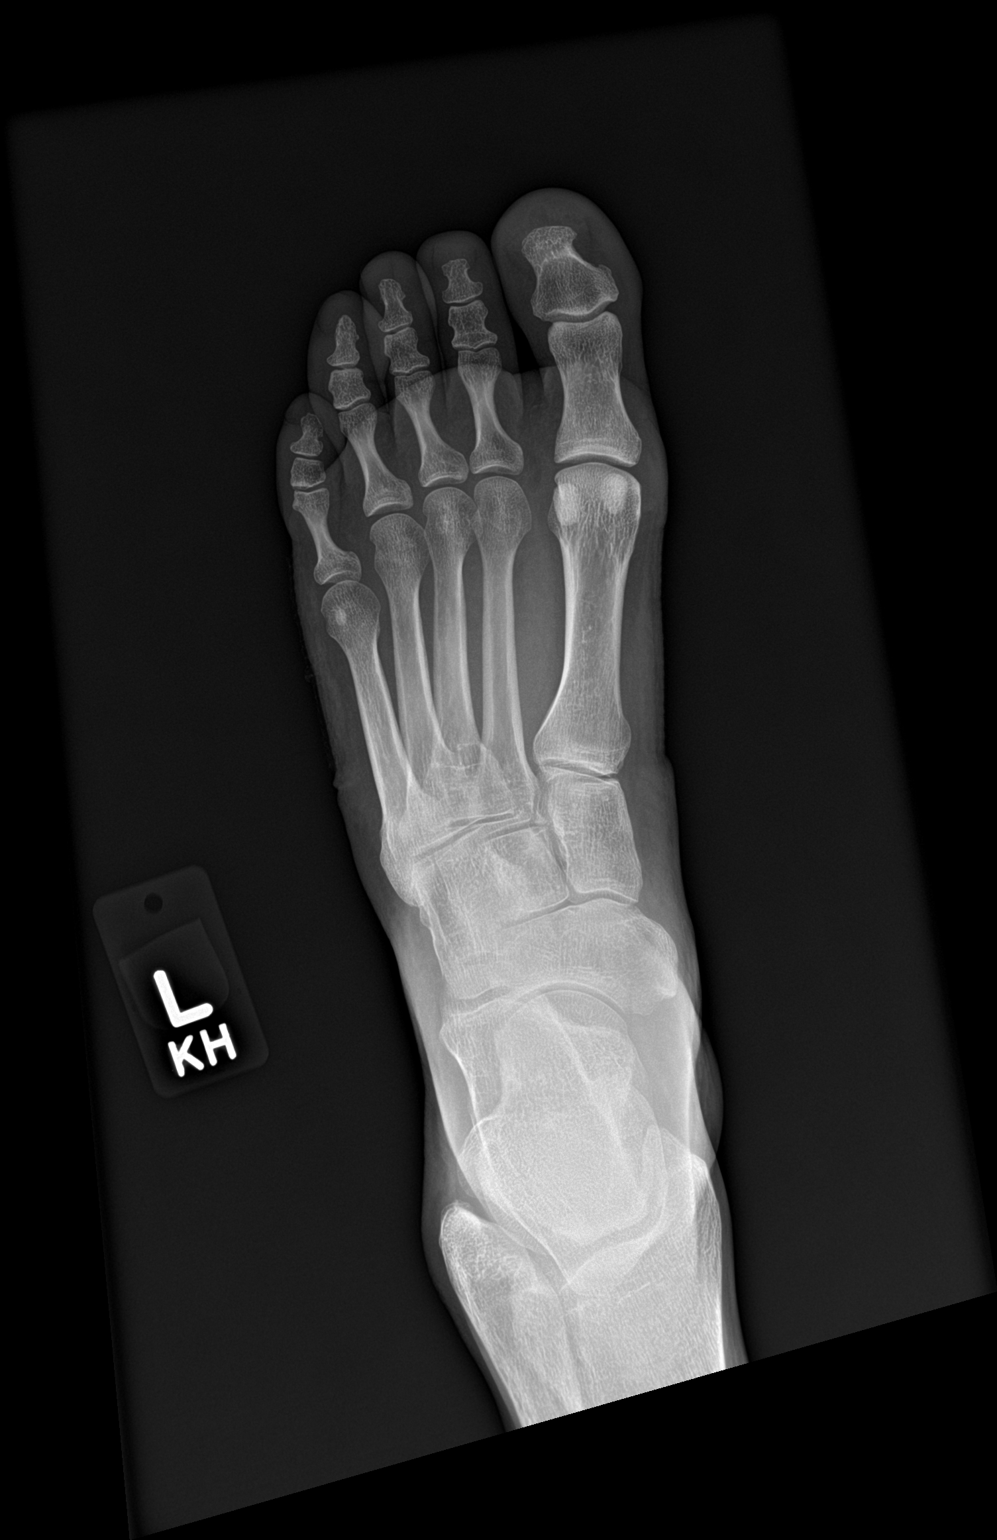

[foot obl]
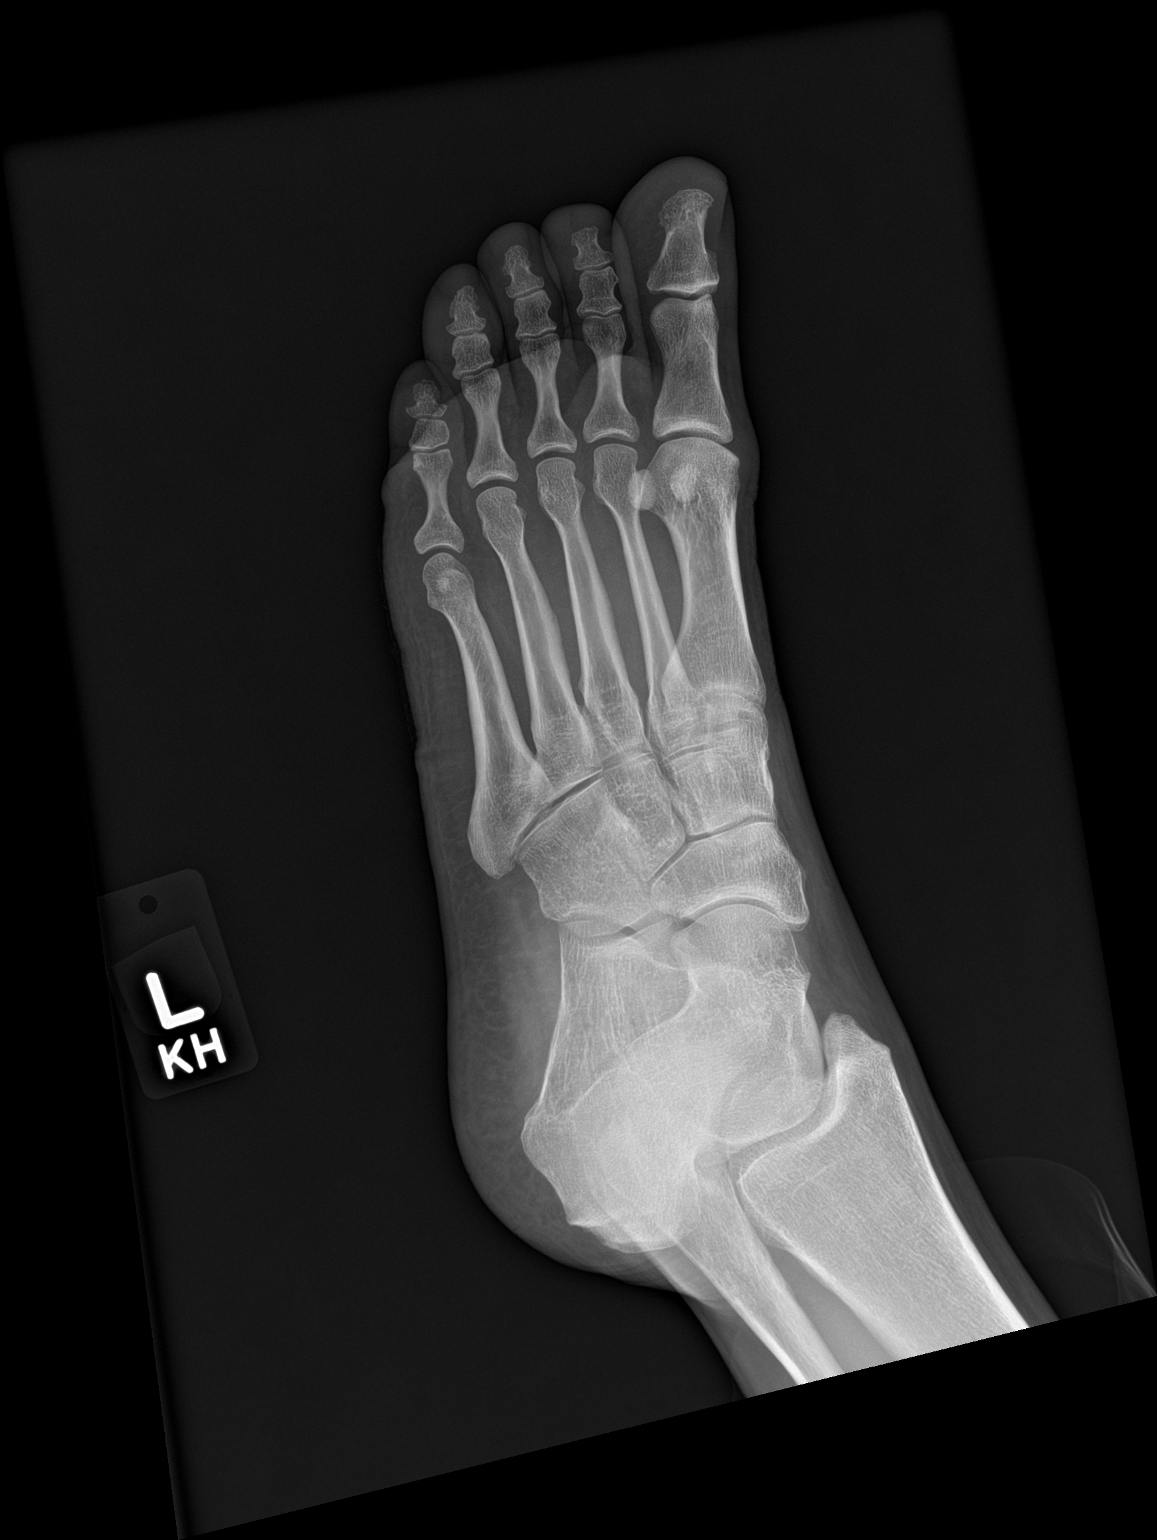

[foot lat]
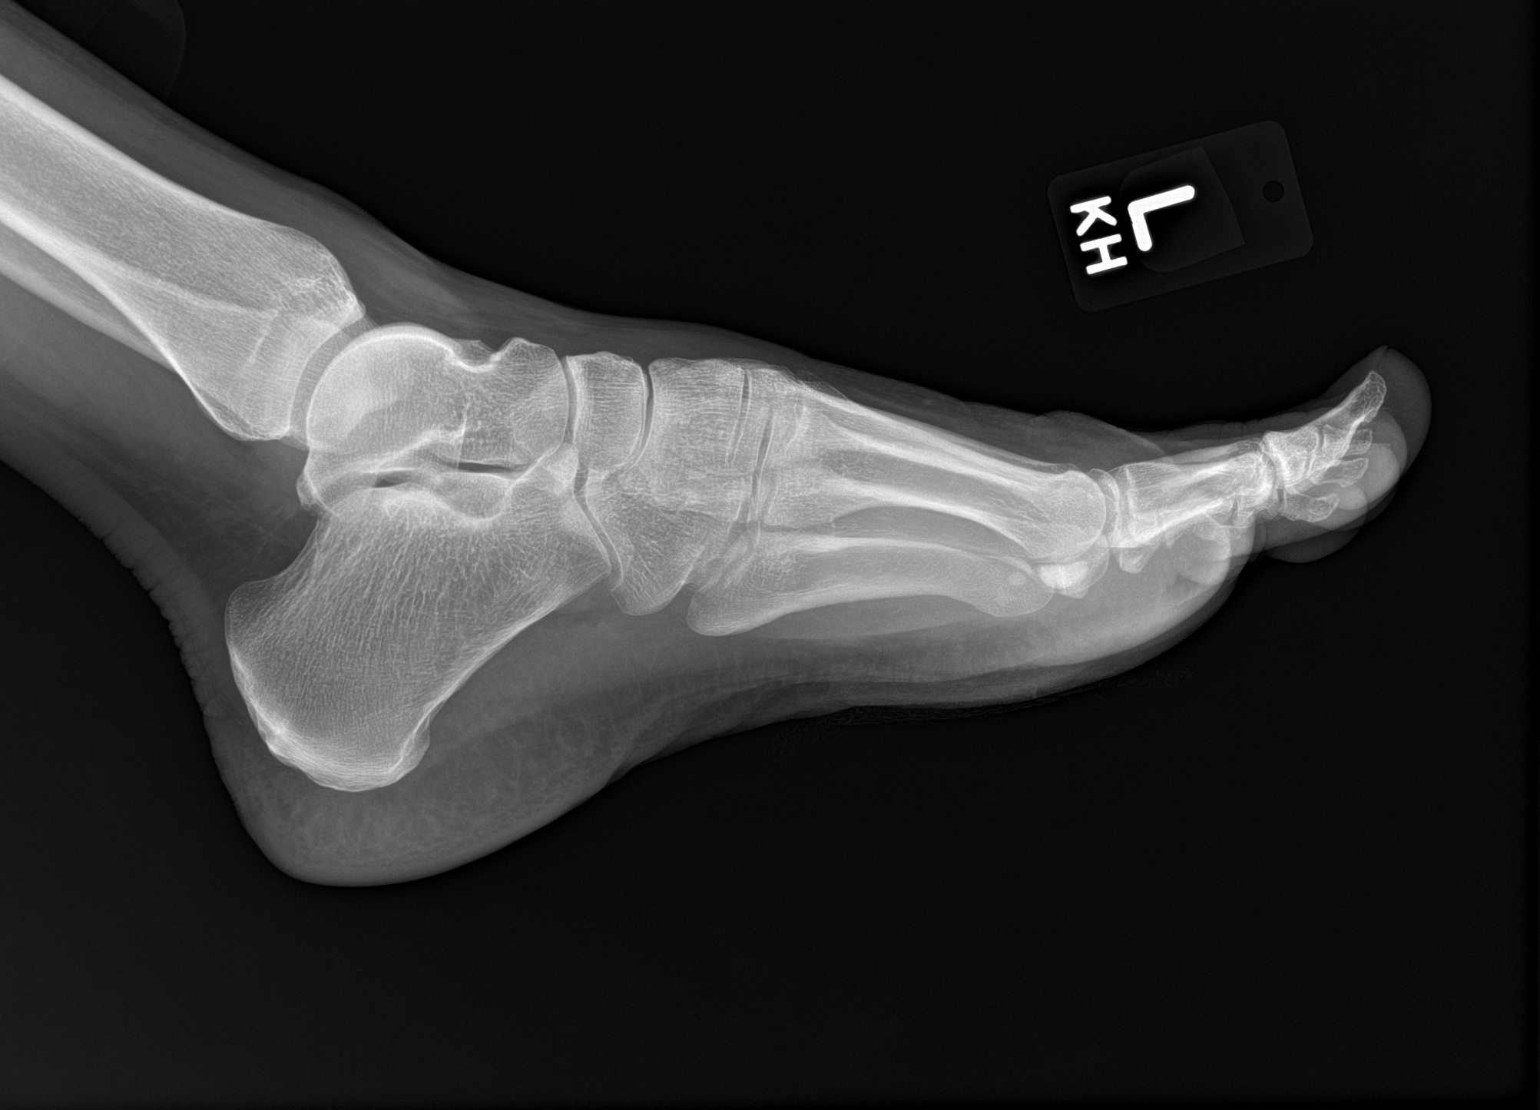

[3 of 3 positions shown; findings below may reference images not displayed]

FINDINGS: There is no evidence of acute fracture or dislocation.

No radiopaque foreign bodies are noted.

No focal bony lesions are identified.
IMPRESSION: No evidence of radiopaque foreign body or acute bony abnormality.

## 2023-08-09 ENCOUNTER — Encounter: Payer: Self-pay | Admitting: Pharmacist

## 2023-08-09 ENCOUNTER — Other Ambulatory Visit: Payer: Self-pay

## 2023-08-09 ENCOUNTER — Other Ambulatory Visit (HOSPITAL_COMMUNITY): Payer: Self-pay

## 2023-08-12 ENCOUNTER — Other Ambulatory Visit: Payer: Self-pay

## 2023-08-15 ENCOUNTER — Other Ambulatory Visit: Payer: Self-pay

## 2023-08-15 ENCOUNTER — Other Ambulatory Visit (HOSPITAL_COMMUNITY): Payer: Self-pay

## 2023-10-06 ENCOUNTER — Other Ambulatory Visit (HOSPITAL_COMMUNITY): Payer: Self-pay

## 2023-10-06 MED ORDER — TRAZODONE HCL 50 MG PO TABS
50.0000 mg | ORAL_TABLET | Freq: Every evening | ORAL | 0 refills | Status: AC | PRN
  Filled 2023-10-06: qty 30, 30d supply, fill #0

## 2023-10-17 ENCOUNTER — Other Ambulatory Visit: Payer: Self-pay | Admitting: Obstetrics and Gynecology

## 2023-10-17 DIAGNOSIS — Z1231 Encounter for screening mammogram for malignant neoplasm of breast: Secondary | ICD-10-CM

## 2023-11-01 ENCOUNTER — Other Ambulatory Visit (HOSPITAL_COMMUNITY): Payer: Self-pay

## 2023-11-01 MED ORDER — LEVOTHYROXINE SODIUM 50 MCG PO TABS
50.0000 ug | ORAL_TABLET | Freq: Every morning | ORAL | 0 refills | Status: AC
  Filled 2024-05-11: qty 90, 90d supply, fill #0

## 2023-11-09 DIAGNOSIS — Z23 Encounter for immunization: Secondary | ICD-10-CM | POA: Diagnosis not present

## 2023-11-09 DIAGNOSIS — Z0000 Encounter for general adult medical examination without abnormal findings: Secondary | ICD-10-CM | POA: Diagnosis not present

## 2023-11-09 DIAGNOSIS — F5101 Primary insomnia: Secondary | ICD-10-CM | POA: Diagnosis not present

## 2023-11-09 DIAGNOSIS — F900 Attention-deficit hyperactivity disorder, predominantly inattentive type: Secondary | ICD-10-CM | POA: Diagnosis not present

## 2023-11-09 DIAGNOSIS — E039 Hypothyroidism, unspecified: Secondary | ICD-10-CM | POA: Diagnosis not present

## 2023-11-17 ENCOUNTER — Other Ambulatory Visit (HOSPITAL_COMMUNITY): Payer: Self-pay

## 2023-11-17 ENCOUNTER — Other Ambulatory Visit: Payer: Self-pay

## 2023-11-17 MED ORDER — LEVOTHYROXINE SODIUM 50 MCG PO TABS
50.0000 ug | ORAL_TABLET | Freq: Every morning | ORAL | 3 refills | Status: AC
  Filled 2023-11-17: qty 90, 90d supply, fill #0
  Filled 2024-02-08: qty 90, 90d supply, fill #1
  Filled 2024-05-18: qty 90, 90d supply, fill #2

## 2023-12-29 ENCOUNTER — Ambulatory Visit
Admission: RE | Admit: 2023-12-29 | Discharge: 2023-12-29 | Disposition: A | Discharge status: Home / Self Care | Source: Ambulatory Visit | Attending: Obstetrics and Gynecology | Admitting: Obstetrics and Gynecology

## 2023-12-29 DIAGNOSIS — Z1231 Encounter for screening mammogram for malignant neoplasm of breast: Secondary | ICD-10-CM

## 2024-01-11 ENCOUNTER — Ambulatory Visit
Admission: RE | Admit: 2024-01-11 | Discharge: 2024-01-11 | Disposition: A | Source: Ambulatory Visit | Attending: Obstetrics and Gynecology | Admitting: Obstetrics and Gynecology

## 2024-01-11 DIAGNOSIS — Z1231 Encounter for screening mammogram for malignant neoplasm of breast: Secondary | ICD-10-CM | POA: Diagnosis not present

## 2024-01-23 ENCOUNTER — Other Ambulatory Visit (HOSPITAL_COMMUNITY): Payer: Self-pay

## 2024-01-23 MED ORDER — COVID-19 MRNA VAC-TRIS(PFIZER) 30 MCG/0.3ML IM SUSY
0.3000 mL | PREFILLED_SYRINGE | Freq: Once | INTRAMUSCULAR | 0 refills | Status: AC
  Filled 2024-01-23: qty 0.3, 1d supply, fill #0

## 2024-01-23 MED ORDER — FLUZONE 0.5 ML IM SUSY
0.5000 mL | PREFILLED_SYRINGE | Freq: Once | INTRAMUSCULAR | 0 refills | Status: AC
  Filled 2024-01-23: qty 0.5, 1d supply, fill #0

## 2024-02-03 ENCOUNTER — Other Ambulatory Visit (HOSPITAL_COMMUNITY): Payer: Self-pay

## 2024-02-06 DIAGNOSIS — Z6822 Body mass index (BMI) 22.0-22.9, adult: Secondary | ICD-10-CM | POA: Diagnosis not present

## 2024-02-06 DIAGNOSIS — Z01419 Encounter for gynecological examination (general) (routine) without abnormal findings: Secondary | ICD-10-CM | POA: Diagnosis not present

## 2024-02-06 DIAGNOSIS — Z3040 Encounter for surveillance of contraceptives, unspecified: Secondary | ICD-10-CM | POA: Diagnosis not present

## 2024-02-06 DIAGNOSIS — Z1211 Encounter for screening for malignant neoplasm of colon: Secondary | ICD-10-CM | POA: Diagnosis not present

## 2024-02-06 DIAGNOSIS — Z1330 Encounter for screening examination for mental health and behavioral disorders, unspecified: Secondary | ICD-10-CM | POA: Diagnosis not present

## 2024-02-06 DIAGNOSIS — Z1231 Encounter for screening mammogram for malignant neoplasm of breast: Secondary | ICD-10-CM | POA: Diagnosis not present

## 2024-02-08 ENCOUNTER — Other Ambulatory Visit: Payer: Self-pay

## 2024-05-11 ENCOUNTER — Other Ambulatory Visit: Payer: Self-pay

## 2024-05-14 ENCOUNTER — Other Ambulatory Visit: Payer: Self-pay

## 2024-05-18 ENCOUNTER — Other Ambulatory Visit (HOSPITAL_COMMUNITY): Payer: Self-pay
